# Patient Record
Sex: Male | Born: 1940 | Race: White | Hispanic: No | Marital: Married | Smoking: Former smoker
Health system: Southern US, Community
[De-identification: ages and names within clinical notes are randomized; demographics above are authoritative.]

## PROBLEM LIST (undated history)

## (undated) DIAGNOSIS — R7303 Prediabetes: Secondary | ICD-10-CM

## (undated) DIAGNOSIS — Z8489 Family history of other specified conditions: Secondary | ICD-10-CM

## (undated) DIAGNOSIS — J449 Chronic obstructive pulmonary disease, unspecified: Secondary | ICD-10-CM

## (undated) DIAGNOSIS — I10 Essential (primary) hypertension: Secondary | ICD-10-CM

## (undated) DIAGNOSIS — N184 Chronic kidney disease, stage 4 (severe): Secondary | ICD-10-CM

## (undated) DIAGNOSIS — I4821 Permanent atrial fibrillation: Secondary | ICD-10-CM

## (undated) DIAGNOSIS — I700 Atherosclerosis of aorta: Secondary | ICD-10-CM

## (undated) DIAGNOSIS — I499 Cardiac arrhythmia, unspecified: Secondary | ICD-10-CM

## (undated) DIAGNOSIS — M109 Gout, unspecified: Secondary | ICD-10-CM

## (undated) HISTORY — DX: Chronic obstructive pulmonary disease, unspecified: J44.9

## (undated) HISTORY — DX: Prediabetes: R73.03

## (undated) HISTORY — DX: Atherosclerosis of aorta: I70.0

## (undated) HISTORY — DX: Permanent atrial fibrillation: I48.21

## (undated) HISTORY — DX: Gout, unspecified: M10.9

## (undated) HISTORY — PX: ATRIAL FIBRILLATION ABLATION: SHX5732

## (undated) HISTORY — DX: Chronic kidney disease, stage 4 (severe): N18.4

---

## 2020-07-22 ENCOUNTER — Other Ambulatory Visit: Payer: Self-pay

## 2020-07-22 ENCOUNTER — Ambulatory Visit (INDEPENDENT_AMBULATORY_CARE_PROVIDER_SITE_OTHER): Payer: Medicare Other

## 2020-07-22 ENCOUNTER — Encounter: Payer: Self-pay | Admitting: Pulmonary Disease

## 2020-07-22 ENCOUNTER — Ambulatory Visit (INDEPENDENT_AMBULATORY_CARE_PROVIDER_SITE_OTHER): Payer: Medicare Other | Admitting: Pulmonary Disease

## 2020-07-22 VITALS — BP 116/74 | HR 79 | Ht 67.0 in | Wt 165.6 lb

## 2020-07-22 DIAGNOSIS — J439 Emphysema, unspecified: Secondary | ICD-10-CM

## 2020-07-22 DIAGNOSIS — J441 Chronic obstructive pulmonary disease with (acute) exacerbation: Secondary | ICD-10-CM | POA: Diagnosis not present

## 2020-07-22 NOTE — Patient Instructions (Signed)
Is good to meet you We will get an x-ray today and records from primary care regarding your recent labs It is okay to stop the anoro as you are asymptomatic with no exacerbations We will schedule PFTs in 3 months and follow-up in clinic on the same day.

## 2020-07-22 NOTE — Progress Notes (Signed)
Cory Morris    [id number]    September 24, 1940  Primary Care Physician:Freeman, Rocco Pauls, NP  Referring Physician: Roetta Sessions, NP [address] [place],  [place] [address]  Chief complaint:   Consult for COPD  HPI: 79 year old with history of chronic kidney disease stage III, hypertension, COPD, hyperlipidemia, gout, atrial fibrillation, HFpEF.  Referred for evaluation of COPD.  He is moved here recently from [place] to be closer to family.  Previously followed by Dr. Candelaria Celeste at [facility].  Maintained on anoro which has not made any difference with his breathing. He is relatively sedentary and denies any dyspnea, cough, mucus production, wheezing.  He wants to know if he can come off anoro as its not helping and is expensive.  Pets: No pets Occupation: Retired Retail buyer Exposures: No exposure to asbestos.  No mold, hot tub, Jacuzzi.  No feather pillows or comforters Smoking history: 60-pack-year smoker.  Quit in April 2021 Travel history: From [place].  No significant recent travel Relevant family history: No significant family history of lung disease   No outpatient encounter medications on file as of 07/22/2020.   No facility-administered encounter medications on file as of 07/22/2020.    Allergies as of 07/22/2020  . (Not on File)    No past medical history on file.  History reviewed. No pertinent surgical history.  No family history on file.  Social History   Socioeconomic History  . Marital status: Married    Spouse name: Not on file  . Number of children: Not on file  . Years of education: Not on file  . Highest education level: Not on file  Occupational History  . Not on file  Tobacco Use  . Smoking status: Current Every Day Smoker    Packs/day: 1.00    Years: 60.00    Pack years: 60.00    Types: Cigarettes  . Smokeless tobacco: Never Used  Substance and Sexual Activity  . Alcohol  use: Not on file  . Drug use: Not on file  . Sexual activity: Not on file  Other Topics Concern  . Not on file  Social History Narrative  . Not on file   Social Determinants of Health   Financial Resource Strain: Not on file  Food Insecurity: Not on file  Transportation Needs: Not on file  Physical Activity: Not on file  Stress: Not on file  Social Connections: Not on file  Intimate Partner Violence: Not on file    Review of systems: Review of Systems  Constitutional: Negative for fever and chills.  HENT: Negative.   Eyes: Negative for blurred vision.  Respiratory: as per HPI  Cardiovascular: Negative for chest pain and palpitations.  Gastrointestinal: Negative for vomiting, diarrhea, blood per rectum. Genitourinary: Negative for dysuria, urgency, frequency and hematuria.  Musculoskeletal: Negative for myalgias, back pain and joint pain.  Skin: Negative for itching and rash.  Neurological: Negative for dizziness, tremors, focal weakness, seizures and loss of consciousness.  Endo/Heme/Allergies: Negative for environmental allergies.  Psychiatric/Behavioral: Negative for depression, suicidal ideas and hallucinations.  All other systems reviewed and are negative.  Physical Exam: Blood pressure 116/74, pulse 79, height 5\' 7"  (1.702 m), weight 165 lb 9.6 oz (75.1 kg), SpO2 99 %. Gen:      No acute distress HEENT:  EOMI, sclera anicteric Neck:     No masses; no thyromegaly Lungs:    Clear to auscultation bilaterally; normal respiratory effort  CV:         Regular rate and rhythm; no murmurs Abd:      + bowel sounds; soft, non-tender; no palpable masses, no distension Ext:    No edema; adequate peripheral perfusion Skin:      Warm and dry; no rash Neuro: alert and oriented x 3 Psych: normal mood and affect  Data Reviewed: Imaging:  PFTs:  Labs:  Assessment:  COPD Get chest x-ray, PFTs for baseline assessment He recently had labs at his primary care.  We will get  those for our record.  Okay to stop anoro as it does not help and is expensive. He is asymptomatic with his breathing.  Ex-smoker Congratulated on quitting smoking Review chest x-ray He is beyond the age cutoff for screening CTs of the chest.  Plan/Recommendations: Chest x-ray, PFTs Stop Truddie Crumble MD [facility] 07/22/2020, 9:52 AM  CC: Roetta Sessions*

## 2020-10-20 ENCOUNTER — Ambulatory Visit (INDEPENDENT_AMBULATORY_CARE_PROVIDER_SITE_OTHER): Payer: Medicare Other | Admitting: Pulmonary Disease

## 2020-10-20 ENCOUNTER — Ambulatory Visit: Payer: Medicare Other | Admitting: Pulmonary Disease

## 2020-10-20 ENCOUNTER — Other Ambulatory Visit: Payer: Self-pay

## 2020-10-20 DIAGNOSIS — J439 Emphysema, unspecified: Secondary | ICD-10-CM

## 2020-10-20 LAB — PULMONARY FUNCTION TEST
DL/VA % pred: 60 %
DL/VA: 2.41 ml/min/mmHg/L
DLCO cor % pred: 45 %
DLCO cor: 9.67 ml/min/mmHg
DLCO unc % pred: 45 %
DLCO unc: 9.67 ml/min/mmHg
FEF 25-75 Post: 0.65 L/sec
FEF 25-75 Pre: 0.43 L/sec
FEF2575-%Change-Post: 52 %
FEF2575-%Pred-Post: 40 %
FEF2575-%Pred-Pre: 26 %
FEV1-%Change-Post: 18 %
FEV1-%Pred-Post: 50 %
FEV1-%Pred-Pre: 42 %
FEV1-Post: 1.19 L
FEV1-Pre: 1.01 L
FEV1FVC-%Change-Post: 2 %
FEV1FVC-%Pred-Pre: 67 %
FEV6-%Change-Post: 16 %
FEV6-%Pred-Post: 74 %
FEV6-%Pred-Pre: 63 %
FEV6-Post: 2.32 L
FEV6-Pre: 1.99 L
FEV6FVC-%Change-Post: 0 %
FEV6FVC-%Pred-Post: 103 %
FEV6FVC-%Pred-Pre: 103 %
FVC-%Change-Post: 16 %
FVC-%Pred-Post: 71 %
FVC-%Pred-Pre: 61 %
FVC-Post: 2.42 L
FVC-Pre: 2.09 L
Post FEV1/FVC ratio: 49 %
Post FEV6/FVC ratio: 96 %
Pre FEV1/FVC ratio: 48 %
Pre FEV6/FVC Ratio: 95 %
RV % pred: 161 %
RV: 3.93 L
TLC % pred: 100 %
TLC: 6.27 L

## 2020-10-20 NOTE — Progress Notes (Signed)
PFT done today. 

## 2020-11-11 ENCOUNTER — Ambulatory Visit (INDEPENDENT_AMBULATORY_CARE_PROVIDER_SITE_OTHER): Payer: Medicare Other | Admitting: Pulmonary Disease

## 2020-11-11 ENCOUNTER — Other Ambulatory Visit: Payer: Self-pay

## 2020-11-11 ENCOUNTER — Encounter: Payer: Self-pay | Admitting: Pulmonary Disease

## 2020-11-11 VITALS — BP 112/70 | HR 101 | Temp 97.5°F | Ht 67.0 in | Wt 159.8 lb

## 2020-11-11 DIAGNOSIS — J449 Chronic obstructive pulmonary disease, unspecified: Secondary | ICD-10-CM | POA: Diagnosis not present

## 2020-11-11 DIAGNOSIS — J439 Emphysema, unspecified: Secondary | ICD-10-CM | POA: Diagnosis not present

## 2020-11-11 MED ORDER — ALBUTEROL SULFATE HFA 108 (90 BASE) MCG/ACT IN AERS: 2.0000 | INHALATION_SPRAY | Freq: Four times a day (QID) | RESPIRATORY_TRACT | 6 refills | Status: DC | PRN

## 2020-11-11 MED ORDER — IPRATROPIUM-ALBUTEROL 0.5-2.5 (3) MG/3ML IN SOLN: 3.0000 mL | Freq: Four times a day (QID) | RESPIRATORY_TRACT | Status: DC | PRN

## 2020-11-11 NOTE — Addendum Note (Signed)
Addended by: Gavin Potters R on: 11/11/2020 10:58 AM   Modules accepted: Orders

## 2020-11-11 NOTE — Patient Instructions (Signed)
I have reviewed your PFTs which show severe COPD We discussed treatment with inhalers and per your preference we are holding off on regular treatment We will send in a prescription for albuterol rescue inhaler and duo nebs 3 times a day as needed for shortness of breath We will get records and labs from your primary care  Follow-up in 6 months

## 2020-11-11 NOTE — Progress Notes (Addendum)
       Cory Morris    [id number]    11/14/1940  Primary Care Physician:Freeman, Courtney James, NP  Referring Physician: Freeman, Courtney James, NP [address] [place],  [place] [address]  Chief complaint:   Follow up for COPD  HPI: 79-year-old with history of chronic kidney disease stage III, hypertension, COPD, hyperlipidemia, gout, atrial fibrillation, HFpEF.  Referred for evaluation of COPD.  He is moved here recently from [place] to be closer to family.  Previously followed by Dr. Leah Morris at [place] clinic.  Maintained on anoro which has not made any difference with his breathing. He is relatively sedentary and denies any dyspnea, cough, mucus production, wheezing.  He wants to know if he can come off anoro as its not helping and is expensive.  Pets: No pets Occupation: Retired telephone maintenance technician Exposures: No exposure to asbestos.  No mold, hot tub, Jacuzzi.  No feather pillows or comforters Smoking history: 60-pack-year smoker.  Quit in April 2021 Travel history: From [place].  No significant recent travel Relevant family history: No significant family history of lung disease  Interim history: Off anoro since December 2021 States that breathing is stable with no complaints Here for review of PFTs  Outpatient Encounter Medications as of 11/11/2020  Medication Sig  . allopurinol (ZYLOPRIM) 100 MG tablet Take 100 mg by mouth.  . Ascorbic Acid (VITAMIN C WITH ROSE HIPS) 1000 MG tablet Take 1,000 mg by mouth daily.  . atorvastatin (LIPITOR) 80 MG tablet Take 80 mg by mouth daily.  . cholecalciferol (VITAMIN D3) 25 MCG (1000 UNIT) tablet Take 2,000 Units by mouth daily.  . diltiazem (CARDIZEM CD) 120 MG 24 hr capsule Take 120 mg by mouth daily.  . latanoprost (XALATAN) 0.005 % ophthalmic solution 1 drop at bedtime.  . metoprolol tartrate (LOPRESSOR) 100 MG tablet Take 100 mg by mouth 2 (two) times daily.  . sertraline (ZOLOFT) 50 MG  tablet Take 50 mg by mouth daily.  . torsemide (DEMADEX) 20 MG tablet Take 20 mg by mouth daily. Monday Wednesday Friday   No facility-administered encounter medications on file as of 11/11/2020.    Physical Exam: Blood pressure 112/70, pulse (!) 101, temperature (!) 97.5 F (36.4 C), temperature source Temporal, height 5' 7" (1.702 m), weight 159 lb 12.8 oz (72.5 kg), SpO2 96 %. Gen:      No acute distress HEENT:  EOMI, sclera anicteric Neck:     No masses; no thyromegaly Lungs:    Clear to auscultation bilaterally; normal respiratory effort CV:         Regular rate and rhythm; no murmurs Abd:      + bowel sounds; soft, non-tender; no palpable masses, no distension Ext:    No edema; adequate peripheral perfusion Skin:      Warm and dry; no rash Neuro: alert and oriented x 3 Psych: normal mood and affect  Data Reviewed: Imaging: Chest x-ray 07/22/2020-small bilateral effusions, basal atelectasis, hyperinflation I have reviewed the images personally.  PFTs: 10/20/2020 FVC 2.42 [71%], FEV1 1.19 [50%], F/F 49, TLC 6.27 [100%], DLCO 9.67 [45%] Severe obstruction, diffusion defect with air trapping and bronchodilator response  Labs:  Assessment:  COPD Chest x-ray and PFT reviewed with severe COPD He recently had labs at his primary care.  We will get those for our record.  Off anoro since December 2021 I had a long discussion in office with Cory Morris and his daughter.  Recommended controller inhalers to reduce   exacerbations but the medications are too expensive for him and he has not been compliant as they are not helping with his breathing and he feels fine  Give him a prescription for albuterol and duo nebs as needed Reassess in 6 months  Ex-smoker Congratulated on quitting smoking He is beyond the age cutoff for screening CTs of the chest. Follow intermittent chest x-ray  Plan/Recommendations: Albuterol, duo nebs as needed Labs from primary care  Cory Mannam  Morris [facility] 11/11/2020, 9:54 AM  CC: Cory Morris*  Addendum: Received records from primary care  CBC 09/23/2020-WBC 7.58, eos 4.2% absolute eosinophil count 320 CBC 06/16/2020- WBC 8.67, eos 6%, absolute eosinophil count 520  CMP 09/23/2020 significant for alk phos 160, BUN 55, creatinine 2.19  Cory Morris [facility] 12/15/2020, 12:32 PM  

## 2020-11-30 ENCOUNTER — Telehealth: Payer: Self-pay | Admitting: Pulmonary Disease

## 2020-11-30 NOTE — Telephone Encounter (Signed)
Received a PA determination for albuterol HFA from [facility]. PA was approved until 08/12/2021. Called and spoke with pharmacist at [facility]. She was able to run the RX without any issues.   Nothing further needed.

## 2021-04-14 ENCOUNTER — Telehealth: Payer: Self-pay | Admitting: Pulmonary Disease

## 2021-04-14 NOTE — Telephone Encounter (Signed)
Pt sent a message via MyChart;  "Chest congestion, phlegmy cough, using inhaler four times a day to improve breathing"  Pt was last seen in [facility] by Dr. Vaughan Browner 11/11/2020 and is due for a follow up in October.  Pls regard; [phone]

## 2021-04-14 NOTE — Telephone Encounter (Signed)
I called and spoke with patient wife, who is on DPR, regarding mychart message. Wife ststed patient has been having more congestion and cough. Wife is requesting appt with PM, who is booked until October. I have made an appt with TP for 04/24/21. Wife verbalized understanding, nothing further needed

## 2021-04-24 ENCOUNTER — Ambulatory Visit: Payer: Medicare Other | Admitting: Adult Health

## 2021-05-29 DIAGNOSIS — E261 Secondary hyperaldosteronism: Secondary | ICD-10-CM | POA: Insufficient documentation

## 2021-05-29 DIAGNOSIS — E785 Hyperlipidemia, unspecified: Secondary | ICD-10-CM | POA: Insufficient documentation

## 2021-05-29 DIAGNOSIS — F3340 Major depressive disorder, recurrent, in remission, unspecified: Secondary | ICD-10-CM | POA: Insufficient documentation

## 2021-05-29 DIAGNOSIS — I482 Chronic atrial fibrillation, unspecified: Secondary | ICD-10-CM | POA: Insufficient documentation

## 2021-05-29 DIAGNOSIS — M109 Gout, unspecified: Secondary | ICD-10-CM | POA: Insufficient documentation

## 2021-05-29 DIAGNOSIS — I700 Atherosclerosis of aorta: Secondary | ICD-10-CM | POA: Insufficient documentation

## 2021-05-29 DIAGNOSIS — R059 Cough, unspecified: Secondary | ICD-10-CM | POA: Insufficient documentation

## 2021-05-29 DIAGNOSIS — I10 Essential (primary) hypertension: Secondary | ICD-10-CM | POA: Insufficient documentation

## 2021-05-29 DIAGNOSIS — R7303 Prediabetes: Secondary | ICD-10-CM | POA: Insufficient documentation

## 2021-05-29 DIAGNOSIS — N184 Chronic kidney disease, stage 4 (severe): Secondary | ICD-10-CM | POA: Insufficient documentation

## 2021-05-29 DIAGNOSIS — D8989 Other specified disorders involving the immune mechanism, not elsewhere classified: Secondary | ICD-10-CM | POA: Insufficient documentation

## 2021-05-29 DIAGNOSIS — J449 Chronic obstructive pulmonary disease, unspecified: Secondary | ICD-10-CM | POA: Insufficient documentation

## 2021-05-29 DIAGNOSIS — I509 Heart failure, unspecified: Secondary | ICD-10-CM | POA: Insufficient documentation

## 2021-07-10 ENCOUNTER — Other Ambulatory Visit: Payer: Self-pay

## 2021-07-10 ENCOUNTER — Encounter: Payer: Self-pay | Admitting: Internal Medicine

## 2021-07-10 ENCOUNTER — Ambulatory Visit (INDEPENDENT_AMBULATORY_CARE_PROVIDER_SITE_OTHER): Payer: Medicare Other | Admitting: Internal Medicine

## 2021-07-10 VITALS — BP 110/60 | HR 77 | Ht 67.0 in | Wt 166.8 lb

## 2021-07-10 DIAGNOSIS — N184 Chronic kidney disease, stage 4 (severe): Secondary | ICD-10-CM | POA: Diagnosis not present

## 2021-07-10 DIAGNOSIS — I4821 Permanent atrial fibrillation: Secondary | ICD-10-CM | POA: Diagnosis not present

## 2021-07-10 DIAGNOSIS — Z7901 Long term (current) use of anticoagulants: Secondary | ICD-10-CM | POA: Diagnosis not present

## 2021-07-10 DIAGNOSIS — J438 Other emphysema: Secondary | ICD-10-CM | POA: Diagnosis not present

## 2021-07-10 NOTE — Progress Notes (Signed)
OFFICE NOTE  Chief Complaint:  Established cardiologist  Primary Care Physician: Roetta Sessions, NP  HPI:  Cory Morris is a 80 y.o. male with a past medial history significant for A. fib, COPD, stage IV chronic kidney disease, depression, gout and aortic atherosclerosis, who presents to establish cardiac care.  Cory Morris [place].  He is accompanied by his daughter today.  She and her husband moved to the area a few years ago and recently moved him down here.  He previously was getting care at the [facility] clinic and his cardiologist was Dr. Daisy Floro.  With regards to his A. fib, it is likely he has permanent A. fib at this point.  He has reportedly had multiple prior cardioversions and ablation.  I will need to request those records.  He denies any obstructive coronary history such as CABG or prior PCI.  He reported he had a fall last evening.  He says he has had several falls over the past several years.  This occurred when getting out of his recliner.  He said he stood up and took a step and then became somewhat dizzy and fell.  He denies loss of consciousness.  He said he felt like the room might have been spinning which is suggestive of possible positional vertigo.  Does use a walker and has some marked kyphosis and is getting home PT.  PMHx:  Past Medical History:  Diagnosis Date   Aortic atherosclerosis (HCC)    CKD (chronic kidney disease) stage 4, GFR 15-29 ml/min (HCC)    COPD (chronic obstructive pulmonary disease) (HCC)    Gout    Permanent atrial fibrillation (HCC)    Prediabetes     Past Surgical History:  Procedure Laterality Date   ATRIAL FIBRILLATION ABLATION      FAMHx:  Family History  Problem Relation Age of Onset   Atrial fibrillation Neg Hx    Arrhythmia Neg Hx     SOCHx:   reports that he has quit smoking. His smoking use included cigarettes. He has a 60.00 pack-year smoking history. He  has never used smokeless tobacco. No history on file for alcohol use and drug use.  ALLERGIES:  Allergies  Allergen Reactions   Ace Inhibitors    Dronedarone     ROS: Pertinent items noted in HPI and remainder of comprehensive ROS otherwise negative.  HOME MEDS: Current Outpatient Medications on File Prior to Visit  Medication Sig Dispense Refill   albuterol (VENTOLIN HFA) 108 (90 Base) MCG/ACT inhaler Inhale 2 puffs into the lungs every 6 (six) hours as needed for wheezing or shortness of breath. 8 g 6   allopurinol (ZYLOPRIM) 100 MG tablet Take 100 mg by mouth.     Ascorbic Acid (VITAMIN C WITH ROSE HIPS) 1000 MG tablet Take 1,000 mg by mouth daily.     atorvastatin (LIPITOR) 80 MG tablet Take 80 mg by mouth daily.     cholecalciferol (VITAMIN D3) 25 MCG (1000 UNIT) tablet Take 2,000 Units by mouth daily.     diltiazem (CARDIZEM CD) 120 MG 24 hr capsule Take 120 mg by mouth daily.     ipratropium-albuterol (DUONEB) 0.5-2.5 (3) MG/3ML SOLN Take 3 mLs by nebulization every 6 (six) hours as needed. 360 mL o   latanoprost (XALATAN) 0.005 % ophthalmic solution 1 drop at bedtime.     metoprolol tartrate (LOPRESSOR) 100 MG tablet Take 100 mg by mouth 2 (two) times daily.  sertraline (ZOLOFT) 50 MG tablet Take 50 mg by mouth daily.     torsemide (DEMADEX) 20 MG tablet Take 20 mg by mouth daily. Monday Wednesday Friday     No current facility-administered medications on file prior to visit.    LABS/IMAGING: No results found for this or any previous visit (from the past 48 hour(s)). No results found.  LIPID PANEL: No results found for: CHOL, TRIG, HDL, CHOLHDL, VLDL, LDLCALC, LDLDIRECT   WEIGHTS: Wt Readings from Last 3 Encounters:  07/10/21 166 lb 12.8 oz (75.7 kg)  11/11/20 159 lb 12.8 oz (72.5 kg)  07/22/20 165 lb 9.6 oz (75.1 kg)    VITALS: BP 110/60   Pulse 77   Ht 5\' 7"  (1.702 m)   Wt 166 lb 12.8 oz (75.7 kg)   SpO2 92%   BMI 26.12 kg/m   EXAM: General  appearance: alert, no distress, and marked kyphosis Neck: no carotid bruit, no JVD, and thyroid not enlarged, symmetric, no tenderness/mass/nodules Lungs: diminished breath sounds bilaterally Heart: irregularly irregular rhythm Abdomen: soft, non-tender; bowel sounds normal; no masses,  no organomegaly Extremities: extremities normal, atraumatic, no cyanosis or edema and pain with palpation over the right lower posterior ribs, no ecchymosis Pulses: 2+ and symmetric Skin: Skin color, texture, turgor normal. No rashes or lesions Neurologic: Grossly normal Psych: Pleasant  EKG: A. fib at 77, RBBB- personally reviewed  ASSESSMENT: Probable permanent atrial fibrillation COPD Aortic atherosclerosis CKD 4  PLAN: 1.   [name] has probable permanent atrial fibrillation with prior ablation and multiple cardioversion attempts at the [facility] clinic.  We will obtain full records and review them.  He is anticoagulated on Eliquis 2.5 mg twice daily for age greater than 80 and creatinine greater than 1.5.  COPD is managed locally now by Dr. Vaughan Browner.  He also is followed by Dr. Candiss Norse in nephrology.  We will continue current medications and plan annual follow-up or sooner as necessary.  Thanks for the kind referral.  Pixie Casino, MD, FACC, [place] Director of the [facility] Diplomate of the American Board of Clinical Lipidology Attending Cardiologist  Direct Dial: [phone]  Fax: [phone]  Website:  [email or web address] 07/10/2021, 4:33 PM

## 2021-07-10 NOTE — Patient Instructions (Signed)

## 2021-09-13 ENCOUNTER — Encounter: Payer: Self-pay | Admitting: Pulmonary Disease

## 2021-09-13 ENCOUNTER — Other Ambulatory Visit: Payer: Self-pay

## 2021-09-13 ENCOUNTER — Ambulatory Visit (INDEPENDENT_AMBULATORY_CARE_PROVIDER_SITE_OTHER): Payer: Medicare Other

## 2021-09-13 ENCOUNTER — Ambulatory Visit (INDEPENDENT_AMBULATORY_CARE_PROVIDER_SITE_OTHER): Payer: Medicare Other | Admitting: Pulmonary Disease

## 2021-09-13 VITALS — BP 116/62 | HR 99 | Temp 98.1°F | Ht 66.0 in | Wt 160.0 lb

## 2021-09-13 DIAGNOSIS — J449 Chronic obstructive pulmonary disease, unspecified: Secondary | ICD-10-CM

## 2021-09-13 MED ORDER — IPRATROPIUM-ALBUTEROL 0.5-2.5 (3) MG/3ML IN SOLN: 3.0000 mL | Freq: Four times a day (QID) | RESPIRATORY_TRACT | 5 refills | Status: DC | PRN

## 2021-09-13 MED ORDER — ANORO ELLIPTA 62.5-25 MCG/ACT IN AEPB: 1.0000 | INHALATION_SPRAY | Freq: Every day | RESPIRATORY_TRACT | 2 refills | Status: DC

## 2021-09-13 MED ORDER — ALBUTEROL SULFATE HFA 108 (90 BASE) MCG/ACT IN AERS: 2.0000 | INHALATION_SPRAY | Freq: Four times a day (QID) | RESPIRATORY_TRACT | 5 refills | Status: DC | PRN

## 2021-09-13 NOTE — Patient Instructions (Signed)
Continue the prednisone and antibiotic as prescribed by primary care Continue the anoro inhaler.  We will send in a new prescription for this I will also check with the pharmacy to see if there are any cheaper alternatives.  It will be helpful if you can update your insurance information so we can get the most up-to-date information  We will get a chest x-ray Check CBC differential, IgE, CMP alpha-1 antitrypsin levels and phenotype today Follow-up in 4 weeks

## 2021-09-13 NOTE — Progress Notes (Signed)
Cory Morris    [id number]    1941/08/09  Primary Care Physician:Freeman, Rocco Pauls, NP  Referring Physician: Roetta Sessions, NP [address] [place],   [address]  Chief complaint:   Follow up for COPD  HPI: 81 year old with history of chronic kidney disease stage III, hypertension, COPD, hyperlipidemia, gout, atrial fibrillation, HFpEF.  Referred for evaluation of COPD.  He is moved here recently from [place] to be closer to family.  Previously followed by Dr. Candelaria Celeste at [facility] clinic.  Maintained on anoro which has not made any difference with his breathing. He is relatively sedentary and denies any dyspnea, cough, mucus production, wheezing.   Off anoro since December 2021 per patient preference due to cost of medication  Pets: No pets Occupation: Retired Retail buyer Exposures: No exposure to asbestos.  No mold, hot tub, Jacuzzi.  No feather pillows or comforters Smoking history: 60-pack-year smoker.  Quit in April 2021 Travel history: From [place].  No significant recent travel Relevant family history: No significant family history of lung disease  Interim history: Has worsening dyspnea since December 2023.  Tested negative for COVID Complains of cough, wheezing, clear mucus Started on prednisone 20 mg twice daily and doxycycline by his primary care He restarted anoro a few weeks ago  Outpatient Encounter Medications as of 09/13/2021  Medication Sig   albuterol (VENTOLIN HFA) 108 (90 Base) MCG/ACT inhaler Inhale 2 puffs into the lungs every 6 (six) hours as needed for wheezing or shortness of breath.   allopurinol (ZYLOPRIM) 100 MG tablet Take 100 mg by mouth.   Ascorbic Acid (VITAMIN C WITH ROSE HIPS) 1000 MG tablet Take 1,000 mg by mouth daily.   atorvastatin (LIPITOR) 80 MG tablet Take 80 mg by mouth daily.   cholecalciferol (VITAMIN D3) 25 MCG (1000 UNIT) tablet Take 2,000 Units by mouth daily.    diltiazem (CARDIZEM CD) 120 MG 24 hr capsule Take 120 mg by mouth daily.   DOXYCYCLINE PO Take by mouth in the morning and at bedtime. Started 1/31   ipratropium-albuterol (DUONEB) 0.5-2.5 (3) MG/3ML SOLN Take 3 mLs by nebulization every 6 (six) hours as needed.   latanoprost (XALATAN) 0.005 % ophthalmic solution 1 drop at bedtime.   metoprolol tartrate (LOPRESSOR) 100 MG tablet Take 100 mg by mouth 2 (two) times daily.   PREDNISONE PO Take by mouth. 10 day taper- started 1/31   sertraline (ZOLOFT) 50 MG tablet Take 50 mg by mouth daily.   torsemide (DEMADEX) 20 MG tablet Take 20 mg by mouth daily. Monday Wednesday Friday   No facility-administered encounter medications on file as of 09/13/2021.    Physical Exam: Blood pressure 116/62, pulse 99, temperature 98.1 F (36.7 C), temperature source Oral, height _0  (1.676 m), weight 160 lb (72.6 kg), SpO2 96 %. Gen:      No acute distress HEENT:  EOMI, sclera anicteric Neck:     No masses; no thyromegaly Lungs:    Clear to auscultation bilaterally; normal respiratory effort CV:         Regular rate and rhythm; no murmurs Abd:      + bowel sounds; soft, non-tender; no palpable masses, no distension Ext:    No edema; adequate peripheral perfusion Skin:      Warm and dry; no rash Neuro: alert and oriented x 3 Psych: normal mood and affect   Data Reviewed: Imaging: Chest x-ray 07/22/2020-small bilateral effusions, basal atelectasis, hyperinflation  I have reviewed the images personally.  PFTs: 10/20/2020 FVC 2.42 [71%], FEV1 1.19 [50%], F/F 49, TLC 6.27 [100%], DLCO 9.67 [45%] Severe obstruction, diffusion defect with air trapping and bronchodilator response  Labs: Labs from primary care CBC 09/23/2020-WBC 7.58, eos 4.2% absolute eosinophil count 320 CBC 06/16/2020- WBC 8.67, eos 6%, absolute eosinophil count 520 CMP 09/23/2020 significant for alk phos 160, BUN 55, creatinine 2.19  Assessment:  COPD with exacerbation Chest x-ray and PFT  reviewed with severe COPD Off anoro since December 2021  Restarted recently after he had a worsening breathing.  Seems to be in COPD exacerbation Started on prednisone, doxycycline by his primary care We will get chest x-ray and labs today We will work with pharmacy to see if there is a cheaper alternative to anoro as its Kozlow somebody in the past  Plan/Recommendations: Resume anoro Prednisone, doxycycline Chest x-ray, labs  Marshell Garfinkel MD [facility] none 09/13/2021, 3:31 PM  CC: Roetta Sessions*

## 2021-09-14 ENCOUNTER — Ambulatory Visit: Payer: Medicare Other | Admitting: Pulmonary Disease

## 2021-09-14 ENCOUNTER — Other Ambulatory Visit (HOSPITAL_COMMUNITY): Payer: Self-pay

## 2021-09-14 ENCOUNTER — Encounter: Payer: Self-pay | Admitting: Pulmonary Disease

## 2021-09-14 ENCOUNTER — Telehealth: Payer: Self-pay | Admitting: Pharmacy Technician

## 2021-09-14 DIAGNOSIS — J439 Emphysema, unspecified: Secondary | ICD-10-CM

## 2021-09-14 DIAGNOSIS — J449 Chronic obstructive pulmonary disease, unspecified: Secondary | ICD-10-CM

## 2021-09-14 LAB — COMPREHENSIVE METABOLIC PANEL
ALT: 8 U/L (ref 0–53)
AST: 13 U/L (ref 0–37)
Albumin: 3.9 g/dL (ref 3.5–5.2)
Alkaline Phosphatase: 127 U/L — ABNORMAL HIGH (ref 39–117)
BUN: 42 mg/dL — ABNORMAL HIGH (ref 6–23)
CO2: 33 mEq/L — ABNORMAL HIGH (ref 19–32)
Calcium: 9.1 mg/dL (ref 8.4–10.5)
Chloride: 96 mEq/L (ref 96–112)
Creatinine, Ser: 2.26 mg/dL — ABNORMAL HIGH (ref 0.40–1.50)
GFR: 26.57 mL/min — ABNORMAL LOW (ref >60.00)
Glucose, Bld: 103 mg/dL — ABNORMAL HIGH (ref 70–99)
Potassium: 4.6 mEq/L (ref 3.5–5.1)
Sodium: 140 mEq/L (ref 135–145)
Total Bilirubin: 0.6 mg/dL (ref 0.2–1.2)
Total Protein: 7.3 g/dL (ref 6.0–8.3)

## 2021-09-14 LAB — CBC WITH DIFFERENTIAL/PLATELET
Basophils Absolute: 0.1 10*3/uL (ref 0.0–0.1)
Basophils Relative: 0.8 % (ref 0.0–3.0)
Eosinophils Absolute: 0.2 10*3/uL (ref 0.0–0.7)
Eosinophils Relative: 1.4 % (ref 0.0–5.0)
HCT: 39.6 % (ref 39.0–52.0)
Hemoglobin: 12.6 g/dL — ABNORMAL LOW (ref 13.0–17.0)
Lymphocytes Relative: 8.7 % — ABNORMAL LOW (ref 12.0–46.0)
Lymphs Abs: 1.3 10*3/uL (ref 0.7–4.0)
MCHC: 31.8 g/dL (ref 30.0–36.0)
MCV: 88.6 fl (ref 78.0–100.0)
Monocytes Absolute: 1.6 10*3/uL — ABNORMAL HIGH (ref 0.1–1.0)
Monocytes Relative: 10.6 % (ref 3.0–12.0)
Neutro Abs: 11.5 10*3/uL — ABNORMAL HIGH (ref 1.4–7.7)
Neutrophils Relative %: 78.5 % — ABNORMAL HIGH (ref 43.0–77.0)
Platelets: 246 10*3/uL (ref 150.0–400.0)
RBC: 4.47 Mil/uL (ref 4.22–5.81)
RDW: 16.3 % — ABNORMAL HIGH (ref 11.5–15.5)
WBC: 14.7 10*3/uL — ABNORMAL HIGH (ref 4.0–10.5)

## 2021-09-14 NOTE — Telephone Encounter (Signed)
-----   Message from Marshell Garfinkel, MD sent at 09/13/2021  4:47 PM EST ----- Patient is on inhaler but cannot afford medication.  Can you see if there are any other alternatives.  He would benefit from being on triple therapy with inhaled steroids, LABA and LAMA

## 2021-09-14 NOTE — Telephone Encounter (Signed)
Patient Advocate Encounter   Per Test Claim: Stiolto and Cory Morris are both $47. I'm not sure how much the pt paid for the Anoro, but it's too soon to fill until 10/06/21. It may be that they have a deductible to meet and once they meet that the Anoro will also be $47, but I don't know that for sure.

## 2021-09-15 NOTE — Telephone Encounter (Signed)
ATC patient.  LM to call office for lab results and inhaler information.

## 2021-09-19 NOTE — Telephone Encounter (Signed)
Called and spoke with patient's daughter. She verbalized understanding of lab results. She stated that at the time of the visit and lab draw, he had not started the prednisone yet. She also stated that he hadn't been on prednisone for the past 3-4 months. She is concerned about the WBC count.   When I mentioned breathing test, she was confused. He is scheduled for a F/U later this month but no PFT has been scheduled. She wanted to know if this was something he needed to have before his F/U.   While on the phone, I spoke with her about the inhaler options. She verbalized understanding and for now will stay with the Anoro since it is the same price as the other inhalers.   Dr. Vaughan Browner, can you please advise? Thanks!

## 2021-09-19 NOTE — Telephone Encounter (Addendum)
Yes. We did not order PFTs at last visit.  So they are not pending.  It was a mistake on my part.  Regarding the white blood count I do not have a baseline to compare it with.  Can you put in follow-up CBC to make sure it is improving

## 2021-09-20 NOTE — Addendum Note (Signed)
Addended by: Elby Beck R on: 09/20/2021 11:00 AM   Modules accepted: Orders

## 2021-09-20 NOTE — Telephone Encounter (Signed)
Called and spoke with Cory Morris to let her know of message from Dr. Vaughan Browner and that he would like to order repeat blood work so that we can see if his numbers are improving. She expressed understanding. Order has been placed. Nothing further needed at this time.

## 2021-09-25 ENCOUNTER — Other Ambulatory Visit (INDEPENDENT_AMBULATORY_CARE_PROVIDER_SITE_OTHER): Payer: Medicare Other

## 2021-09-25 DIAGNOSIS — J449 Chronic obstructive pulmonary disease, unspecified: Secondary | ICD-10-CM | POA: Diagnosis not present

## 2021-09-25 DIAGNOSIS — J439 Emphysema, unspecified: Secondary | ICD-10-CM | POA: Diagnosis not present

## 2021-09-25 LAB — CBC WITH DIFFERENTIAL/PLATELET
Basophils Absolute: 0.1 10*3/uL (ref 0.0–0.1)
Basophils Relative: 0.6 % (ref 0.0–3.0)
Eosinophils Absolute: 0.3 10*3/uL (ref 0.0–0.7)
Eosinophils Relative: 2.4 % (ref 0.0–5.0)
HCT: 39.4 % (ref 39.0–52.0)
Hemoglobin: 12.5 g/dL — ABNORMAL LOW (ref 13.0–17.0)
Lymphocytes Relative: 17 % (ref 12.0–46.0)
Lymphs Abs: 1.8 10*3/uL (ref 0.7–4.0)
MCHC: 31.8 g/dL (ref 30.0–36.0)
MCV: 88.2 fl (ref 78.0–100.0)
Monocytes Absolute: 1.2 10*3/uL — ABNORMAL HIGH (ref 0.1–1.0)
Monocytes Relative: 11.2 % (ref 3.0–12.0)
Neutro Abs: 7.4 10*3/uL (ref 1.4–7.7)
Neutrophils Relative %: 68.8 % (ref 43.0–77.0)
Platelets: 201 10*3/uL (ref 150.0–400.0)
RBC: 4.46 Mil/uL (ref 4.22–5.81)
RDW: 16.9 % — ABNORMAL HIGH (ref 11.5–15.5)
WBC: 10.8 10*3/uL — ABNORMAL HIGH (ref 4.0–10.5)

## 2021-09-25 LAB — IGE: IgE (Immunoglobulin E), Serum: 275 kU/L — ABNORMAL HIGH (ref <=114)

## 2021-09-25 LAB — ALPHA-1 ANTITRYPSIN PHENOTYPE: A-1 Antitrypsin, Ser: 240 mg/dL — ABNORMAL HIGH (ref 83–199)

## 2021-10-09 ENCOUNTER — Ambulatory Visit (INDEPENDENT_AMBULATORY_CARE_PROVIDER_SITE_OTHER): Payer: Medicare Other | Admitting: Pulmonary Disease

## 2021-10-09 ENCOUNTER — Encounter: Payer: Self-pay | Admitting: Pulmonary Disease

## 2021-10-09 ENCOUNTER — Other Ambulatory Visit: Payer: Self-pay

## 2021-10-09 VITALS — BP 116/70 | HR 94 | Temp 98.0°F | Ht 67.0 in | Wt 161.6 lb

## 2021-10-09 DIAGNOSIS — J849 Interstitial pulmonary disease, unspecified: Secondary | ICD-10-CM

## 2021-10-09 DIAGNOSIS — J449 Chronic obstructive pulmonary disease, unspecified: Secondary | ICD-10-CM | POA: Diagnosis not present

## 2021-10-09 MED ORDER — TRELEGY ELLIPTA 200-62.5-25 MCG/ACT IN AEPB: 1.0000 | INHALATION_SPRAY | Freq: Every day | RESPIRATORY_TRACT | 2 refills | Status: DC

## 2021-10-09 NOTE — Progress Notes (Signed)
° °      °Cory Morris    [id number]    04/28/1941 ° °Primary Care Physician:Freeman, Courtney James, NP ° °Referring Physician: Freeman, Courtney James, NP °[address] °,  [place] [address] ° °Chief complaint:   °Follow up for COPD ° °HPI: °81-year-old with history of chronic kidney disease stage III, hypertension, COPD, hyperlipidemia, gout, atrial fibrillation, HFpEF.  Referred for evaluation of COPD. ° °He is moved here recently from [place] to be closer to family.  Previously followed by Dr. Leah Spinner at [place] [facility].  Maintained on anoro which has not made any difference with his breathing. °He is relatively sedentary and denies any dyspnea, cough, mucus production, wheezing.   °Off anoro since December 2021 per patient preference due to cost of medication ° °Pets: No pets °Occupation: Retired telephone maintenance technician °Exposures: No exposure to asbestos.  No mold, hot tub, Jacuzzi.  No feather pillows or comforters °Smoking history: 60-pack-year smoker.  Quit in April 2021 °Travel history: From [place].  No significant recent travel °Relevant family history: No significant family history of lung disease ° °Interim history: °Has worsening dyspnea since December 2023.  Tested negative for COVID °Continues on anoro °Complains of cough, wheezing, clear mucus ° ° °Outpatient Encounter Medications as of 10/09/2021  °Medication Sig  ° albuterol (VENTOLIN HFA) 108 (90 Base) MCG/ACT inhaler Inhale 2 puffs into the lungs every 6 (six) hours as needed for wheezing or shortness of breath.  ° allopurinol (ZYLOPRIM) 100 MG tablet Take 100 mg by mouth.  ° Ascorbic Acid (VITAMIN C WITH ROSE HIPS) 1000 MG tablet Take 1,000 mg by mouth daily.  ° atorvastatin (LIPITOR) 80 MG tablet Take 80 mg by mouth daily.  ° cetirizine (ZYRTEC) 10 MG tablet Take 10 mg by mouth daily.  ° cholecalciferol (VITAMIN D3) 25 MCG (1000 UNIT) tablet Take 2,000 Units by mouth daily.  ° diltiazem (CARDIZEM CD) 120  MG 24 hr capsule Take 120 mg by mouth daily.  ° ELIQUIS 2.5 MG TABS tablet Take 2.5 mg by mouth 2 (two) times daily.  ° ipratropium-albuterol (DUONEB) 0.5-2.5 (3) MG/3ML SOLN Take 3 mLs by nebulization every 6 (six) hours as needed.  ° latanoprost (XALATAN) 0.005 % ophthalmic solution 1 drop at bedtime.  ° metoprolol tartrate (LOPRESSOR) 100 MG tablet Take 100 mg by mouth 2 (two) times daily.  ° sertraline (ZOLOFT) 50 MG tablet Take 50 mg by mouth daily.  ° torsemide (DEMADEX) 20 MG tablet Take 20 mg by mouth daily. Monday Wednesday Friday  ° umeclidinium-vilanterol (ANORO ELLIPTA) 62.5-25 MCG/ACT AEPB Inhale 1 puff into the lungs daily.  ° [DISCONTINUED] DOXYCYCLINE PO Take by mouth in the morning and at bedtime. Started 1/31  ° [DISCONTINUED] metoprolol succinate (TOPROL-XL) 100 MG 24 hr tablet Take by mouth.  ° [DISCONTINUED] PREDNISONE PO Take by mouth. 10 day taper- started 1/31  ° °No facility-administered encounter medications on file as of 10/09/2021.  ° ° °Physical Exam: °Blood pressure 116/70, pulse 94, temperature 98 °F (36.7 °C), temperature source Oral, height 5' 7" (1.702 m), weight 161 lb 9.6 oz (73.3 kg), SpO2 97 %. °Gen:      No acute distress °HEENT:  EOMI, sclera anicteric °Neck:     No masses; no thyromegaly °Lungs:    Clear to auscultation bilaterally; normal respiratory effort °CV:         Regular rate and rhythm; no murmurs °Abd:      + bowel sounds; soft, non-tender; no palpable masses,   bowel sounds; soft, non-tender; no palpable masses, no distension Ext:    No edema; adequate peripheral perfusion Skin:      Warm and dry; no rash Neuro: alert and oriented x 3 Psych: normal mood and affect   Data Reviewed: Imaging: Chest x-ray 07/22/2020-small bilateral effusions, basal atelectasis, hyperinflation Chest x-ray 09/13/2021-chronic interstitial changes I have reviewed the images personally.  PFTs: 10/20/2020 FVC 2.42 [71%], FEV1 1.19 [50%], F/F 49, TLC 6.27 [100%], DLCO 9.67 [45%] Severe obstruction, diffusion defect with air trapping  and bronchodilator response  Labs: Labs from primary care CBC 09/23/2020-WBC 7.58, eos 4.2% absolute eosinophil count 320 CBC 06/16/2020- WBC 8.67, eos 6%, absolute eosinophil count 520 CMP 09/23/2020 significant for alk phos 160, BUN 55, creatinine 2.19  CBC 09/25/2021-WBC 10.8, eos 2.4%, absolute eosinophil count 259 IgE 09/13/2021-275 Alpha-1 antitrypsin 09/13/2021-240, PI MM  Assessment:  COPD, asthma Change Anoro to Trelegy as he continues to be symptomatic He does have bronchodilator response, elevated peripheral eosinophils and IgE suggestive of COPD, asthma overlap syndrome Chest x-ray reviewed with mild interstitial changes.  Order high-res CT for evaluation  Plan/Recommendations: Change Anoro to Trelegy High-res CT  Marshell Garfinkel MD [facility] none 10/09/2021, 10:51 AM  CC: Roetta Sessions*

## 2021-10-09 NOTE — Patient Instructions (Addendum)
We will change anoro to Trelegy 200 We will get a high-res CT to evaluate chronic interstitial changes found on chest x-ray  Follow-up in 3 months

## 2021-10-12 ENCOUNTER — Ambulatory Visit (HOSPITAL_COMMUNITY): Payer: Medicare Other

## 2021-10-30 ENCOUNTER — Encounter: Payer: Self-pay | Admitting: Pulmonary Disease

## 2021-10-31 ENCOUNTER — Encounter: Payer: Self-pay | Admitting: Pulmonary Disease

## 2021-10-31 ENCOUNTER — Other Ambulatory Visit: Payer: Self-pay

## 2021-10-31 ENCOUNTER — Telehealth: Payer: Self-pay | Admitting: Adult Health

## 2021-10-31 ENCOUNTER — Ambulatory Visit (INDEPENDENT_AMBULATORY_CARE_PROVIDER_SITE_OTHER): Payer: Medicare Other | Admitting: Pulmonary Disease

## 2021-10-31 VITALS — BP 116/60 | HR 81 | Temp 97.6°F | Ht 67.0 in | Wt 163.8 lb

## 2021-10-31 DIAGNOSIS — J449 Chronic obstructive pulmonary disease, unspecified: Secondary | ICD-10-CM | POA: Diagnosis not present

## 2021-10-31 DIAGNOSIS — J849 Interstitial pulmonary disease, unspecified: Secondary | ICD-10-CM

## 2021-10-31 MED ORDER — AZITHROMYCIN 250 MG PO TABS: ORAL_TABLET | ORAL | 0 refills | Status: DC

## 2021-10-31 MED ORDER — PREDNISONE 20 MG PO TABS: ORAL_TABLET | ORAL | 0 refills | Status: DC

## 2021-10-31 NOTE — Telephone Encounter (Signed)
A call from pharmacist at [facility] regarding today's prescriptions for Z-Pak and prednisone.  Prescription directions were not clear per pharmacy. ?Prescriptions were clarified with prednisone being 40 mg daily for 5 days ?Z-Pak 250 mg 2 tablets the first day and then 1 tablet daily until gone. ? ?FYI sent to Dr. Vaughan Browner  ? ?

## 2021-10-31 NOTE — Progress Notes (Signed)
? ?      ?Cory Morris    [id number]    [birth year] ? ?Primary Care Physician:Reddy, Mahitha, MD ? ?Referring Physician: Roetta Sessions, NP ?[address] ?[place],  [place] [address] ? ?Chief complaint:   ?Follow up for COPD ? ?HPI: ?81 year old with history of chronic kidney disease stage III, hypertension, COPD, hyperlipidemia, gout, atrial fibrillation, HFpEF.  Referred for evaluation of COPD. ? ?He is moved here recently from [place] to be closer to family.  Previously followed by Dr. Candelaria Celeste at [facility] clinic.  Maintained on anoro which has not made any difference with his breathing. ?He is relatively sedentary and denies any dyspnea, cough, mucus production, wheezing.   ?Off anoro since December 2021 per patient preference due to cost of medication ? ?Pets: No pets ?Occupation: Retired Retail buyer ?Exposures: No exposure to asbestos.  No mold, hot tub, Jacuzzi.  No feather pillows or comforters ?Smoking history: 60-pack-year smoker.  Quit in April 2021 ?Travel history: From [place].  No significant recent travel ?Relevant family history: No significant family history of lung disease ? ?Interim history: ?At last visit anoro was changed to Trelegy.  Has not made the change yet ?Is here as an acute visit for worsening cough, congestion.  Occasional wheeze.  Denies any fevers, chills ? ?Outpatient Encounter Medications as of 10/31/2021  ?Medication Sig  ? albuterol (VENTOLIN HFA) 108 (90 Base) MCG/ACT inhaler Inhale 2 puffs into the lungs every 6 (six) hours as needed for wheezing or shortness of breath.  ? allopurinol (ZYLOPRIM) 100 MG tablet Take 100 mg by mouth.  ? Ascorbic Acid (VITAMIN C WITH ROSE HIPS) 1000 MG tablet Take 1,000 mg by mouth daily.  ? atorvastatin (LIPITOR) 80 MG tablet Take 80 mg by mouth daily.  ? cetirizine (ZYRTEC) 10 MG tablet Take 10 mg by mouth daily.  ? cholecalciferol (VITAMIN D3) 25 MCG (1000 UNIT) tablet Take 2,000 Units by mouth  daily.  ? diltiazem (CARDIZEM CD) 120 MG 24 hr capsule Take 120 mg by mouth daily.  ? ELIQUIS 2.5 MG TABS tablet Take 2.5 mg by mouth 2 (two) times daily.  ? ipratropium-albuterol (DUONEB) 0.5-2.5 (3) MG/3ML SOLN Take 3 mLs by nebulization every 6 (six) hours as needed.  ? latanoprost (XALATAN) 0.005 % ophthalmic solution 1 drop at bedtime.  ? metoprolol tartrate (LOPRESSOR) 100 MG tablet Take 100 mg by mouth 2 (two) times daily.  ? sertraline (ZOLOFT) 50 MG tablet Take 50 mg by mouth daily.  ? torsemide (DEMADEX) 20 MG tablet Take 20 mg by mouth daily. Monday Wednesday Friday  ? Fluticasone-Umeclidin-Vilant (TRELEGY ELLIPTA) 200-62.5-25 MCG/ACT AEPB Inhale 1 puff into the lungs daily. (Patient not taking: Reported on 10/31/2021)  ? ?No facility-administered encounter medications on file as of 10/31/2021.  ? ? ?Physical Exam: ?Blood pressure 116/60, pulse 81, temperature 97.6 ?F (36.4 ?C), temperature source Oral, height $RemoveBefo'5\' 7"'EbBFnbyYxjZ$  (1.702 m), weight 163 lb 12.8 oz (74.3 kg), SpO2 95 %. ?Gen:      No acute distress ?HEENT:  EOMI, sclera anicteric ?Neck:     No masses; no thyromegaly ?Lungs:    Scattered expiratory wheeze, crackle ?CV:         Regular rate and rhythm; no murmurs ?Abd:      + bowel sounds; soft, non-tender; no palpable masses, no distension ?Ext:    No edema; adequate peripheral perfusion ?Skin:      Warm and dry; no rash ?Neuro: alert and oriented x 3 ?Psych:  normal mood and affect  ? ?Data Reviewed: ?Imaging: ?Chest x-ray 07/22/2020-small bilateral effusions, basal atelectasis, hyperinflation ?Chest x-ray 09/13/2021-chronic interstitial changes ?I have reviewed the images personally. ? ?PFTs: ?10/20/2020 ?FVC 2.42 [71%], FEV1 1.19 [50%], F/F 49, TLC 6.27 [100%], DLCO 9.67 [45%] ?Severe obstruction, diffusion defect with air trapping and bronchodilator response ? ?Labs: ?Labs from primary care ?CBC 09/23/2020-WBC 7.58, eos 4.2% absolute eosinophil count 320 ?CBC 06/16/2020- WBC 8.67, eos 6%, absolute eosinophil  count 520 ?CMP 09/23/2020 significant for alk phos 160, BUN 55, creatinine 2.19 ? ?CBC 09/25/2021-WBC 10.8, eos 2.4%, absolute eosinophil count 259 ?IgE 09/13/2021-275 ?Alpha-1 antitrypsin 09/13/2021-240, PI MM ? ?Assessment:  ?Acute exacerbation of COPD ?Treat with Z-Pak and prednisone 40 mg a day for 5 days ? ?COPD, asthma ?Change Anoro to Trelegy as he continues to be symptomatic ?He does have bronchodilator response, elevated peripheral eosinophils and IgE suggestive of COPD, asthma overlap syndrome ?Chest x-ray reviewed with mild interstitial changes.  High-res CT ordered but has not been done yet.  We will push this out to 3 months later as he is in the middle of an exacerbation now ? ? ?Plan/Recommendations: ?Z-Pak, prednisone ?Change Anoro to Trelegy ?High-res CT in 3 months ? ?Marshell Garfinkel MD ?[facility] none ?10/31/2021, 4:19 PM ? ?CC: Roetta Sessions* ? ?

## 2021-10-31 NOTE — Patient Instructions (Signed)
We will give you a Z-Pak and prednisone 40 mg a day for 5 days ?Start the Trelegy inhaler ?We will reschedule the high-res CT for 3 months and follow-up in clinic in 3 months. ?

## 2021-10-31 NOTE — Telephone Encounter (Signed)
Received the following message from patient's daughter:  ? ?"Hi Dr Vaughan Browner! My dad - Cory Morris, birthdate Apr 27, 1941- is having that wheezing again. He has been fine ever since the cycle of prednesone and doxycyclene that he completed on 10 Feb. The wheezing just started yesterday, I have no idea why. It is not horrible but I don't want him to get back to where we were on 1 Feb. He is not coughing - or I haven't heard him cough when I have been with him. He has used his emergency inhaler once yesterday and once today. He did a nebulizer treatment last night and this morning and I advised him to do a treatment tonight if he is still wheezing. Is there anything else that we should be doing? ?Thank you! ?Cory Morris ?Jeff's Daughter" ? ?Dr. Vaughan Browner, can you please advise? Thanks!  ?

## 2021-11-02 NOTE — Telephone Encounter (Signed)
Patient seen as an acute visit.  Nothing further needed. ?

## 2021-12-29 ENCOUNTER — Ambulatory Visit: Payer: Medicare Other | Admitting: Pulmonary Disease

## 2022-01-23 ENCOUNTER — Ambulatory Visit
Admission: RE | Admit: 2022-01-23 | Discharge: 2022-01-23 | Disposition: A | Discharge status: Home / Self Care | Payer: Medicare Other | Source: Ambulatory Visit | Attending: Pulmonary Disease | Admitting: Pulmonary Disease

## 2022-01-23 DIAGNOSIS — J849 Interstitial pulmonary disease, unspecified: Secondary | ICD-10-CM

## 2022-01-31 ENCOUNTER — Encounter: Payer: Self-pay | Admitting: Pulmonary Disease

## 2022-01-31 ENCOUNTER — Ambulatory Visit (INDEPENDENT_AMBULATORY_CARE_PROVIDER_SITE_OTHER): Payer: Medicare Other | Admitting: Pulmonary Disease

## 2022-01-31 VITALS — BP 128/60 | HR 66 | Temp 97.6°F | Ht 67.0 in | Wt 171.2 lb

## 2022-01-31 DIAGNOSIS — J849 Interstitial pulmonary disease, unspecified: Secondary | ICD-10-CM | POA: Diagnosis not present

## 2022-01-31 DIAGNOSIS — J449 Chronic obstructive pulmonary disease, unspecified: Secondary | ICD-10-CM | POA: Diagnosis not present

## 2022-01-31 DIAGNOSIS — J439 Emphysema, unspecified: Secondary | ICD-10-CM

## 2022-01-31 NOTE — Progress Notes (Addendum)
Cory Morris    [id number]    1941/03/25  Primary Care Physician:Reddy, Jama Flavors, MD  Referring Physician: Margretta Sidle, [place],  [place] [address]  Chief complaint:   Follow up for COPD  HPI: 81 year old with history of chronic kidney disease stage III, hypertension, COPD, hyperlipidemia, gout, atrial fibrillation, HFpEF.  Referred for evaluation of COPD.  He is moved here recently from [place] to be closer to family.  Previously followed by Dr. Candelaria Celeste at [facility] clinic.  Maintained on anoro which has not made any difference with his breathing. He is relatively sedentary and denies any dyspnea, cough, mucus production, wheezing.   Off anoro since December 2021 per patient preference due to cost of medication  Pets: No pets Occupation: Retired Retail buyer Exposures: No exposure to asbestos.  No mold, hot tub, Jacuzzi.  No feather pillows or comforters Smoking history: 60-pack-year smoker.  Quit in April 2021 Travel history: From [place].  No significant recent travel Relevant family history: No significant family history of lung disease  Interim history: He had a chest x-ray done earlier this year which showed mild interstitial changes and a follow-up CT high-resolution.  He is here to discuss results   Outpatient Encounter Medications as of 01/31/2022  Medication Sig   albuterol (VENTOLIN HFA) 108 (90 Base) MCG/ACT inhaler Inhale 2 puffs into the lungs every 6 (six) hours as needed for wheezing or shortness of breath.   allopurinol (ZYLOPRIM) 100 MG tablet Take 100 mg by mouth.   Ascorbic Acid (VITAMIN C WITH ROSE HIPS) 1000 MG tablet Take 1,000 mg by mouth daily.   atorvastatin (LIPITOR) 80 MG tablet Take 80 mg by mouth daily.   cetirizine (ZYRTEC) 10 MG tablet Take 10 mg by mouth daily.   cholecalciferol (VITAMIN D3) 25 MCG (1000 UNIT) tablet Take 2,000 Units by mouth daily.   diltiazem (CARDIZEM CD) 120 MG  24 hr capsule Take 120 mg by mouth daily.   ELIQUIS 2.5 MG TABS tablet Take 2.5 mg by mouth 2 (two) times daily.   Fluticasone-Umeclidin-Vilant (TRELEGY ELLIPTA) 200-62.5-25 MCG/ACT AEPB Inhale 1 puff into the lungs daily.   ipratropium-albuterol (DUONEB) 0.5-2.5 (3) MG/3ML SOLN Take 3 mLs by nebulization every 6 (six) hours as needed.   latanoprost (XALATAN) 0.005 % ophthalmic solution 1 drop at bedtime.   metoprolol tartrate (LOPRESSOR) 100 MG tablet Take 100 mg by mouth 2 (two) times daily.   [DISCONTINUED] azithromycin (ZITHROMAX) 250 MG tablet Take as directed   [DISCONTINUED] predniSONE (DELTASONE) 20 MG tablet Take 69m for 5 days   [DISCONTINUED] sertraline (ZOLOFT) 50 MG tablet Take 50 mg by mouth daily.   [DISCONTINUED] torsemide (DEMADEX) 20 MG tablet Take 20 mg by mouth daily. Monday Wednesday Friday   No facility-administered encounter medications on file as of 01/31/2022.    Physical Exam: Blood pressure 116/60, pulse 81, temperature 97.6 F (36.4 C), temperature source Oral, height 5' 7" (1.702 m), weight 163 lb 12.8 oz (74.3 kg), SpO2 95 %. Gen:      No acute distress HEENT:  EOMI, sclera anicteric Neck:     No masses; no thyromegaly Lungs:    Scattered expiratory wheeze, crackle CV:         Regular rate and rhythm; no murmurs Abd:      + bowel sounds; soft, non-tender; no palpable masses, no distension Ext:    No edema; adequate peripheral perfusion Skin:      Warm and  dry; no rash Neuro: alert and oriented x 3 Psych: normal mood and affect   Data Reviewed: Imaging: Chest x-ray 07/22/2020-small bilateral effusions, basal atelectasis, hyperinflation Chest x-ray 09/13/2021-chronic interstitial changes CT high-resolution 01/23/2022-subtle changes of patchy groundglass attenuation and mild septal thickening. I have reviewed the images personally.  PFTs: 10/20/2020 FVC 2.42 [71%], FEV1 1.19 [50%], F/F 49, TLC 6.27 [100%], DLCO 9.67 [45%] Severe obstruction, diffusion  defect with air trapping and bronchodilator response  Labs: Labs from primary care CBC 09/23/2020-WBC 7.58, eos 4.2% absolute eosinophil count 320 CBC 06/16/2020- WBC 8.67, eos 6%, absolute eosinophil count 520 CMP 09/23/2020 significant for alk phos 160, BUN 55, creatinine 2.19  CBC 09/25/2021-WBC 10.8, eos 2.4%, absolute eosinophil count 259 IgE 09/13/2021-275 Alpha-1 antitrypsin 09/13/2021-240, PI MM  Assessment:  Abnormal CT High-res CT shows mild nonspecific changes of patchy attenuation and mild septal thickening.  It is unclear to me if this is significant interstitial lung disease Get baseline CTD serologies and hypersensitivity panel.  Follow-up high-res CT ordered for 6 months  COPD, asthma Continue Trelegy inhaler He does have bronchodilator response, elevated peripheral eosinophils and IgE suggestive of COPD, asthma overlap syndrome  Plan/Recommendations: ILD panel Follow-up CT in 6 months  Marshell Garfinkel MD [facility] none 01/31/2022, 4:14 PM  CC: Margretta Sidle, MD

## 2022-01-31 NOTE — Patient Instructions (Signed)
We will get some labs for interstitial lung disease and sensitivity panel Order follow-up high-res CT in 6 months Return to clinic in 3 months.

## 2022-02-02 LAB — CYCLIC CITRUL PEPTIDE ANTIBODY, IGG: Cyclic Citrullin Peptide Ab: <16 UNITS

## 2022-02-02 LAB — ANTI-DNA ANTIBODY, DOUBLE-STRANDED: ds DNA Ab: 1 IU/mL

## 2022-02-02 LAB — RHEUMATOID FACTOR: Rheumatoid fact SerPl-aCnc: 14 IU/mL — ABNORMAL HIGH (ref <14)

## 2022-02-02 LAB — ANA: Anti Nuclear Antibody (ANA): NEGATIVE

## 2022-02-28 DIAGNOSIS — C3490 Malignant neoplasm of unspecified part of unspecified bronchus or lung: Secondary | ICD-10-CM | POA: Insufficient documentation

## 2022-03-08 ENCOUNTER — Other Ambulatory Visit: Payer: Self-pay | Admitting: Pulmonary Disease

## 2022-03-29 ENCOUNTER — Telehealth: Payer: Self-pay | Admitting: Pulmonary Disease

## 2022-03-29 NOTE — Telephone Encounter (Signed)
Doctors office called [place] to let us know that daughter is concerned about patient taking their emergency inhaler multiple times a day and several times during the night. Looks like patient is on Albuterol inhaler.   Do you want to give the patient a different inhaelr to help him during the day?  Please advise sir

## 2022-03-30 NOTE — Telephone Encounter (Signed)
Can we call to schedule follow up with Dr Vaughan Browner.   Thank you

## 2022-03-30 NOTE — Telephone Encounter (Signed)
Appt scheduled for 04/02/22 with Mannam

## 2022-03-30 NOTE — Telephone Encounter (Signed)
He is already in trelegy inhaler. Please make an appointment with me or APP sonner to assess if his breathing is worse

## 2022-04-02 ENCOUNTER — Encounter: Payer: Self-pay | Admitting: Pulmonary Disease

## 2022-04-02 ENCOUNTER — Ambulatory Visit (INDEPENDENT_AMBULATORY_CARE_PROVIDER_SITE_OTHER): Payer: Medicare Other | Admitting: Pulmonary Disease

## 2022-04-02 VITALS — BP 118/68 | HR 88 | Temp 97.6°F | Ht 67.0 in | Wt 174.2 lb

## 2022-04-02 DIAGNOSIS — J449 Chronic obstructive pulmonary disease, unspecified: Secondary | ICD-10-CM

## 2022-04-02 DIAGNOSIS — J849 Interstitial pulmonary disease, unspecified: Secondary | ICD-10-CM | POA: Diagnosis not present

## 2022-04-02 NOTE — Patient Instructions (Signed)
Continue Trelegy inhaler and rescue medications as prescribed Continue to monitor symptoms Follow-up in December or early January 2024 after CT scan

## 2022-04-02 NOTE — Progress Notes (Signed)
Cory Morris    [id number]    1940/12/29  Primary Care Physician:Reddy, Jama Flavors, MD  Referring Physician: Margretta Sidle, [place],  [place] [address]  Chief complaint:   Follow up for COPD  HPI: 81 year old with history of chronic kidney disease stage III, hypertension, COPD, hyperlipidemia, gout, atrial fibrillation, HFpEF.  Referred for evaluation of COPD.  He is moved here recently from [place] to be closer to family.  Previously followed by Dr. Candelaria Celeste at [facility] clinic.  Maintained on anoro which has not made any difference with his breathing. He is relatively sedentary and denies any dyspnea, cough, mucus production, wheezing.   Off anoro since December 2021 per patient preference due to cost of medication  Pets: No pets Occupation: Retired Retail buyer Exposures: No exposure to asbestos.  No mold, hot tub, Jacuzzi.  No feather pillows or comforters Smoking history: 60-pack-year smoker.  Quit in April 2021 Travel history: From [place].  No significant recent travel Relevant family history: No significant family history of lung disease  Interim history: Continues to have dyspnea on exertion.  Daughter notes that he has increasing episodes of wheezing at night requiring use of albuterol inhaler and nebulizer.  Outpatient Encounter Medications as of 04/02/2022  Medication Sig   albuterol (VENTOLIN HFA) 108 (90 Base) MCG/ACT inhaler Inhale 2 puffs into the lungs every 6 (six) hours as needed for wheezing or shortness of breath.   allopurinol (ZYLOPRIM) 100 MG tablet Take 100 mg by mouth.   Ascorbic Acid (VITAMIN C WITH ROSE HIPS) 1000 MG tablet Take 1,000 mg by mouth daily.   atorvastatin (LIPITOR) 80 MG tablet Take 80 mg by mouth daily.   cetirizine (ZYRTEC) 10 MG tablet Take 10 mg by mouth daily.   cholecalciferol (VITAMIN D3) 25 MCG (1000 UNIT) tablet Take 2,000 Units by mouth daily.   diltiazem (CARDIZEM CD)  120 MG 24 hr capsule Take 120 mg by mouth daily.   ELIQUIS 2.5 MG TABS tablet Take 2.5 mg by mouth 2 (two) times daily.   ipratropium-albuterol (DUONEB) 0.5-2.5 (3) MG/3ML SOLN Take 3 mLs by nebulization every 6 (six) hours as needed.   latanoprost (XALATAN) 0.005 % ophthalmic solution 1 drop at bedtime.   metoprolol tartrate (LOPRESSOR) 100 MG tablet Take 100 mg by mouth 2 (two) times daily.   torsemide (DEMADEX) 10 MG tablet Take by mouth. Taking M,W,F   TRELEGY ELLIPTA 200-62.5-25 MCG/ACT AEPB INHALE ONE PUFF BY MOUTH INTO LUNGS DAILY   No facility-administered encounter medications on file as of 04/02/2022.    Physical Exam: Blood pressure 118/68, pulse 88, temperature 97.6 F (36.4 C), temperature source Oral, height $RemoveBefo'5\' 7"'TnHAuMbRwGX$  (1.702 m), weight 174 lb 3.2 oz (79 kg), SpO2 94 %. Gen:      No acute distress HEENT:  EOMI, sclera anicteric Neck:     No masses; no thyromegaly Lungs:    Clear to auscultation bilaterally; normal respiratory effort CV:         Regular rate and rhythm; no murmurs Abd:      + bowel sounds; soft, non-tender; no palpable masses, no distension Ext:    No edema; adequate peripheral perfusion Skin:      Warm and dry; no rash Neuro: alert and oriented x 3 Psych: normal mood and affect   Data Reviewed: Imaging: Chest x-ray 07/22/2020-small bilateral effusions, basal atelectasis, hyperinflation Chest x-ray 09/13/2021-chronic interstitial changes CT high-resolution 01/23/2022-subtle changes of patchy groundglass attenuation and mild  septal thickening. I have reviewed the images personally.  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  It is unclear to me if this is significant interstitial lung disease CTD serologies are unremarkable.  Follow-up high-res CT ordered for 6 months  COPD, asthma Continue Trelegy inhaler He does have bronchodilator response, elevated peripheral eosinophils and IgE suggestive of COPD, asthma overlap syndrome  With nighttime awakenings we discussed escalation of therapy with Singulair or Dupixent but they prefer to wait and not add additional medication.  Reassess at return visit.  Plan/Recommendations: Follow-up CT Return to clinic in 4 months  Marshell Garfinkel MD [facility] none 04/02/2022, 1:46 PM  CC: Margretta Sidle, MD

## 2022-04-24 ENCOUNTER — Other Ambulatory Visit: Payer: Self-pay | Admitting: Nephrology

## 2022-04-24 DIAGNOSIS — N1832 Chronic kidney disease, stage 3b: Secondary | ICD-10-CM

## 2022-04-27 ENCOUNTER — Ambulatory Visit: Payer: Medicare Other | Admitting: Pulmonary Disease

## 2022-04-30 ENCOUNTER — Ambulatory Visit
Admission: RE | Admit: 2022-04-30 | Discharge: 2022-04-30 | Disposition: A | Discharge status: Home / Self Care | Payer: Medicare Other | Source: Ambulatory Visit | Attending: Nephrology | Admitting: Nephrology

## 2022-04-30 DIAGNOSIS — N1832 Chronic kidney disease, stage 3b: Secondary | ICD-10-CM

## 2022-06-05 ENCOUNTER — Other Ambulatory Visit: Payer: Self-pay | Admitting: Pulmonary Disease

## 2022-07-25 ENCOUNTER — Other Ambulatory Visit: Payer: Medicare Other

## 2022-07-25 ENCOUNTER — Ambulatory Visit
Admission: RE | Admit: 2022-07-25 | Discharge: 2022-07-25 | Disposition: A | Discharge status: Home / Self Care | Payer: Medicare Other | Source: Ambulatory Visit | Attending: Pulmonary Disease | Admitting: Pulmonary Disease

## 2022-07-25 DIAGNOSIS — J849 Interstitial pulmonary disease, unspecified: Secondary | ICD-10-CM

## 2022-08-03 ENCOUNTER — Ambulatory Visit: Payer: Medicare Other | Admitting: Internal Medicine

## 2022-08-09 DIAGNOSIS — N2581 Secondary hyperparathyroidism of renal origin: Secondary | ICD-10-CM | POA: Insufficient documentation

## 2022-08-09 DIAGNOSIS — D631 Anemia in chronic kidney disease: Secondary | ICD-10-CM | POA: Insufficient documentation

## 2022-08-09 DIAGNOSIS — N189 Chronic kidney disease, unspecified: Secondary | ICD-10-CM | POA: Insufficient documentation

## 2022-08-14 DIAGNOSIS — M7022 Olecranon bursitis, left elbow: Secondary | ICD-10-CM | POA: Insufficient documentation

## 2022-08-22 ENCOUNTER — Other Ambulatory Visit: Payer: Self-pay | Admitting: *Deleted

## 2022-08-22 DIAGNOSIS — J849 Interstitial pulmonary disease, unspecified: Secondary | ICD-10-CM

## 2022-08-22 DIAGNOSIS — R911 Solitary pulmonary nodule: Secondary | ICD-10-CM

## 2022-09-05 ENCOUNTER — Other Ambulatory Visit: Payer: Self-pay | Admitting: Pulmonary Disease

## 2022-09-10 ENCOUNTER — Encounter: Payer: Self-pay | Admitting: Nurse Practitioner

## 2022-09-10 ENCOUNTER — Ambulatory Visit: Payer: Medicare Other | Attending: Internal Medicine | Admitting: Nurse Practitioner

## 2022-09-10 VITALS — BP 100/70 | HR 79 | Ht 67.0 in | Wt 174.0 lb

## 2022-09-10 DIAGNOSIS — N184 Chronic kidney disease, stage 4 (severe): Secondary | ICD-10-CM

## 2022-09-10 DIAGNOSIS — J438 Other emphysema: Secondary | ICD-10-CM

## 2022-09-10 DIAGNOSIS — I700 Atherosclerosis of aorta: Secondary | ICD-10-CM

## 2022-09-10 DIAGNOSIS — R600 Localized edema: Secondary | ICD-10-CM

## 2022-09-10 DIAGNOSIS — I4821 Permanent atrial fibrillation: Secondary | ICD-10-CM | POA: Diagnosis present

## 2022-09-10 NOTE — Patient Instructions (Signed)
Medication Instructions:  No changes *If you need a refill on your cardiac medications before your next appointment, please call your pharmacy*  Follow-Up: At [facility], you and your health needs are our priority.  As part of our continuing mission to provide you with exceptional heart care, we have created designated Provider Care Teams.  These Care Teams include your primary Cardiologist (physician) and Advanced Practice Providers (APPs -  Physician Assistants and Nurse Practitioners) who all work together to provide you with the care you need, when you need it.  We recommend signing up for the patient portal called "MyChart".  Sign up information is provided on this After Visit Summary.  MyChart is used to connect with patients for Virtual Visits (Telemedicine).  Patients are able to view lab/test results, encounter notes, upcoming appointments, etc.  Non-urgent messages can be sent to your provider as well.   To learn more about what you can do with MyChart, go to [email or web address].    Your next appointment:   1 year(s)  Provider:   Pixie Casino, MD

## 2022-09-10 NOTE — Progress Notes (Signed)
Office Visit    Patient Name: Cory Morris Date of Encounter: 09/10/2022  Primary Care Provider:  Margretta Sidle, MD Primary Cardiologist:  Pixie Casino, MD  Chief Complaint    82 year old male with a history of permanent atrial fibrillation, aortic atherosclerosis, CKD stage IV, and COPD who presents for follow-up related to atrial fibrillation.  Past Medical History    Past Medical History:  Diagnosis Date   Aortic atherosclerosis (HCC)    CKD (chronic kidney disease) stage 4, GFR 15-29 ml/min (HCC)    COPD (chronic obstructive pulmonary disease) (HCC)    Gout    Permanent atrial fibrillation (HCC)    Prediabetes    Past Surgical History:  Procedure Laterality Date   ATRIAL FIBRILLATION ABLATION      Allergies  Allergies  Allergen Reactions   Ace Inhibitors    Dronedarone      Labs/Other Studies Reviewed   Recent Labs: 09/13/2021: ALT 8; BUN 42; Creatinine, Ser 2.26; Potassium 4.6; Sodium 140 09/25/2021: Hemoglobin 12.5; Platelets 201.0  Recent Lipid Panel No results found for: "CHOL", "TRIG", "HDL", "CHOLHDL", "VLDL", "LDLCALC", "LDLDIRECT"  History of Present Illness    82 year old male with the above past medical history including permanent atrial fibrillation, aortic atherosclerosis, CKD stage IV, and COPD.  He is originally from the [place] area.  He previously followed with cardiology at the [facility] clinic.  He moved to [place] in 2021 and establish care with Dr. Debara Pickett in 2022. He has a history of atrial fibrillation s/p multiple prior cardioversions and ablation.  Additionally, he has a history of CKD, follows with nephrology.  He has a history of COPD and follows with pulmonology.  He was last seen in office on 07/10/2021 and was stable from a cardiac standpoint.   He presents today for follow-up accompanied by his daughter.  Since his last visit he has been stable overall from a cardiac point.  He saw his nephrologist in December who  increased his torsemide to 10 mg daily in the setting of increased lower extremity edema.  He denies any chest pain, palpitations, worsening dyspnea, PND, orthopnea, weight gain.  He has baseline borderline low BP.  Per patient's daughter, he was mildly orthostatic with his nephrologist in December 2023.  He has had issues with balance and did suffer a fall in November 2023.  He denies any falls since, denies bleeding.  Overall, he has been stable.   Home Medications    Current Outpatient Medications  Medication Sig Dispense Refill   albuterol (VENTOLIN HFA) 108 (90 Base) MCG/ACT inhaler Inhale 2 puffs into the lungs every 6 (six) hours as needed for wheezing or shortness of breath. 8 g 5   allopurinol (ZYLOPRIM) 100 MG tablet Take 100 mg by mouth.     Ascorbic Acid (VITAMIN C WITH ROSE HIPS) 1000 MG tablet Take 1,000 mg by mouth daily.     atorvastatin (LIPITOR) 80 MG tablet Take 80 mg by mouth daily.     cetirizine (ZYRTEC) 10 MG tablet Take 10 mg by mouth daily as needed.     cholecalciferol (VITAMIN D3) 25 MCG (1000 UNIT) tablet Take 2,000 Units by mouth daily.     diltiazem (CARDIZEM CD) 120 MG 24 hr capsule Take 120 mg by mouth daily.     ELIQUIS 2.5 MG TABS tablet Take 2.5 mg by mouth 2 (two) times daily.     Fluticasone-Umeclidin-Vilant (TRELEGY ELLIPTA) 200-62.5-25 MCG/ACT AEPB INHALE 1 PUFF BY MOUTH DAILY 60 each 1  ipratropium-albuterol (DUONEB) 0.5-2.5 (3) MG/3ML SOLN Take 3 mLs by nebulization every 6 (six) hours as needed. 360 mL 5   latanoprost (XALATAN) 0.005 % ophthalmic solution 1 drop at bedtime.     metoprolol tartrate (LOPRESSOR) 100 MG tablet Take 100 mg by mouth 2 (two) times daily.     montelukast (SINGULAIR) 10 MG tablet Take 10 mg by mouth daily.     torsemide (DEMADEX) 10 MG tablet Take by mouth. Taking M,W,F     No current facility-administered medications for this visit.     Review of Systems    He denies chest pain, palpitations, dyspnea, pnd, orthopnea, n, v,  dizziness, syncope, weight gain, or early satiety. All other systems reviewed and are otherwise negative except as noted above.   Physical Exam    VS:  BP 100/70   Pulse 79   Ht 5\' 7"  (1.702 m)   Wt 174 lb (78.9 kg)   SpO2 94%   BMI 27.25 kg/m  GEN: Well nourished, well developed, in no acute distress. HEENT: normal. Neck: Supple, no JVD, carotid bruits, or masses. Cardiac: IRIR, no murmurs, rubs, or gallops. No clubbing, cyanosis, nonpitting bilateral lower extremity edema.  Radials/DP/PT 2+ and equal bilaterally.  Respiratory:  Respirations regular and unlabored, clear to auscultation bilaterally. GI: Soft, nontender, nondistended, BS + x 4. MS: no deformity or atrophy. Skin: warm and dry, no rash. Neuro:  Strength and sensation are intact. Psych: Normal affect.  Accessory Clinical Findings    ECG personally reviewed by me today -atrial fibrillation, 79 bpm, RBBB- no acute changes.   Lab Results  Component Value Date   WBC 10.8 (H) 09/25/2021   HGB 12.5 (L) 09/25/2021   HCT 39.4 09/25/2021   MCV 88.2 09/25/2021   PLT 201.0 09/25/2021   Lab Results  Component Value Date   CREATININE 2.26 (H) 09/13/2021   BUN 42 (H) 09/13/2021   NA 140 09/13/2021   K 4.6 09/13/2021   CL 96 09/13/2021   CO2 33 (H) 09/13/2021   Lab Results  Component Value Date   ALT 8 09/13/2021   AST 13 09/13/2021   ALKPHOS 127 (H) 09/13/2021   BILITOT 0.6 09/13/2021   No results found for: "CHOL", "HDL", "LDLCALC", "LDLDIRECT", "TRIG", "CHOLHDL"  No results found for: "HGBA1C"  Assessment & Plan    1. Permanent atrial fibrillation: Rate controlled.  He has had borderline low BP, mild orthostasis, and had a mechanical fall in 06/2022.  Discussed benefits versus risks of continuing blood thinner given history of falls.  Through shared decision-making, patient and daughter prefer to continue Eliquis for now. Discussed ED precautions.  Continue diltiazem, metoprolol, Eliquis at reduced dose.  2.   Bilateral lower extremity edema: Torsemide was increased to 10 mg daily per nephrology in December 2023.  He does have nonpitting bilateral lower extremity edema, otherwise, euvolemic and well compensated on exam.  Appreciate nephrology's assistance in managing diuretic therapy.  3. Aortic atherosclerosis: Noted on prior CT. Continue Lipitor.  4. CKD stage IV: Creatinine was 2.45 in 05/2022.  Follows with nephrology.  5. COPD: He notes stable chronic dyspnea.  Follows with pulmonology.  6. Disposition: Follow-up in 1 year.     Lenna Sciara, NP 09/10/2022, 4:34 PM

## 2022-09-19 ENCOUNTER — Other Ambulatory Visit: Payer: Self-pay | Admitting: Pulmonary Disease

## 2022-10-17 ENCOUNTER — Ambulatory Visit
Admission: RE | Admit: 2022-10-17 | Discharge: 2022-10-17 | Disposition: A | Discharge status: Home / Self Care | Payer: Medicare Other | Source: Ambulatory Visit | Attending: Pulmonary Disease | Admitting: Pulmonary Disease

## 2022-10-17 DIAGNOSIS — R911 Solitary pulmonary nodule: Secondary | ICD-10-CM

## 2022-10-17 DIAGNOSIS — J849 Interstitial pulmonary disease, unspecified: Secondary | ICD-10-CM

## 2022-10-23 ENCOUNTER — Other Ambulatory Visit: Payer: Self-pay | Admitting: *Deleted

## 2022-10-23 DIAGNOSIS — R911 Solitary pulmonary nodule: Secondary | ICD-10-CM

## 2022-10-23 MED ORDER — AZITHROMYCIN 250 MG PO TABS: ORAL_TABLET | ORAL | 0 refills | Status: DC

## 2022-11-08 ENCOUNTER — Other Ambulatory Visit: Payer: Self-pay | Admitting: Pulmonary Disease

## 2022-11-08 ENCOUNTER — Other Ambulatory Visit (HOSPITAL_COMMUNITY): Payer: Medicare Other

## 2022-11-23 DIAGNOSIS — J84112 Idiopathic pulmonary fibrosis: Secondary | ICD-10-CM | POA: Insufficient documentation

## 2022-11-27 ENCOUNTER — Encounter (HOSPITAL_COMMUNITY)
Admission: RE | Admit: 2022-11-27 | Discharge: 2022-11-27 | Disposition: A | Discharge status: Home / Self Care | Payer: Medicare Other | Source: Ambulatory Visit | Attending: Pulmonary Disease | Admitting: Pulmonary Disease

## 2022-11-27 DIAGNOSIS — R911 Solitary pulmonary nodule: Secondary | ICD-10-CM | POA: Diagnosis present

## 2022-11-27 LAB — GLUCOSE, CAPILLARY: Glucose-Capillary: 112 mg/dL — ABNORMAL HIGH (ref 70–99)

## 2022-11-27 MED ORDER — FLUDEOXYGLUCOSE F - 18 (FDG) INJECTION
8.6300 | Freq: Once | INTRAVENOUS | Status: AC | PRN
  Administered 2022-11-27: 8.63 via INTRAVENOUS

## 2022-12-10 ENCOUNTER — Ambulatory Visit (INDEPENDENT_AMBULATORY_CARE_PROVIDER_SITE_OTHER): Payer: Medicare Other | Admitting: Pulmonary Disease

## 2022-12-10 ENCOUNTER — Encounter: Payer: Self-pay | Admitting: Pulmonary Disease

## 2022-12-10 VITALS — BP 100/60 | HR 71 | Ht 67.0 in | Wt 181.2 lb

## 2022-12-10 DIAGNOSIS — R942 Abnormal results of pulmonary function studies: Secondary | ICD-10-CM

## 2022-12-10 DIAGNOSIS — N184 Chronic kidney disease, stage 4 (severe): Secondary | ICD-10-CM

## 2022-12-10 DIAGNOSIS — R911 Solitary pulmonary nodule: Secondary | ICD-10-CM

## 2022-12-10 DIAGNOSIS — J4489 Other specified chronic obstructive pulmonary disease: Secondary | ICD-10-CM | POA: Diagnosis not present

## 2022-12-10 DIAGNOSIS — J439 Emphysema, unspecified: Secondary | ICD-10-CM

## 2022-12-10 NOTE — Patient Instructions (Addendum)
Thank you for visiting Dr. Tonia Brooms at [facility]. Today we recommend the following:  Please let me know when you have seen Dr. [name]   Please do your part to reduce the spread of COVID-19.

## 2022-12-10 NOTE — Progress Notes (Signed)
Synopsis: Referred in April 2024 for lung nodule by Combs, Prince Solian, *  Subjective:   PATIENT ID: Cory Morris GENDER: male DOB: Jan 24, 1941, MRN: [id number]  Chief Complaint  Patient presents with   Consult    Discuss bronch    This is an 82 year old gentleman with chronic kidney disease stage IV, COPD, atrial fibrillation.  Severe cervical and upper thoracic kyphosis.  Followed for COPD management by Dr. Isaiah Serge.  Referred for abnormal CT chest with a left lower lobe pulmonary nodule.  Hypermetabolic on PET imaging concerning for potential underlying malignancy.  Patient was referred for consideration of bronchoscopy.  He does have atrial fibrillation and is on Eliquis.  He has had increase in bilateral lower extremity edema.Recent pet imaging also showed increased development of bilateral pleural effusion.     Past Medical History:  Diagnosis Date   Aortic atherosclerosis (HCC)    CKD (chronic kidney disease) stage 4, GFR 15-29 ml/min (HCC)    COPD (chronic obstructive pulmonary disease) (HCC)    Gout    Permanent atrial fibrillation (HCC)    Prediabetes      Family History  Problem Relation Age of Onset   Atrial fibrillation Neg Hx    Arrhythmia Neg Hx      Past Surgical History:  Procedure Laterality Date   ATRIAL FIBRILLATION ABLATION      Social History   Socioeconomic History   Marital status: Married    Spouse name: Not on file   Number of children: Not on file   Years of education: Not on file   Highest education level: Not on file  Occupational History   Not on file  Tobacco Use   Smoking status: Former    Packs/day: 1.00    Years: 60.00    Additional pack years: 0.00    Total pack years: 60.00    Types: Cigarettes   Smokeless tobacco: Never   Tobacco comments:    quit in April 2021  Substance and Sexual Activity   Alcohol use: Not on file   Drug use: Not on file   Sexual activity: Not on file  Other Topics Concern   Not on file   Social History Narrative   Not on file   Social Determinants of Health   Financial Resource Strain: Not on file  Food Insecurity: Not on file  Transportation Needs: Not on file  Physical Activity: Not on file  Stress: Not on file  Social Connections: Not on file  Intimate Partner Violence: Not on file     Allergies  Allergen Reactions   Ace Inhibitors    Dronedarone      Outpatient Medications Prior to Visit  Medication Sig Dispense Refill   albuterol (VENTOLIN HFA) 108 (90 Base) MCG/ACT inhaler INHALE TWO PUFFS BY MOUTH INTO LUNGS EVERY 6 HOURS AS NEEDED FOR WHEEZING/SHORTNESS OF BREATH 18 g 5   allopurinol (ZYLOPRIM) 100 MG tablet Take 100 mg by mouth.     Ascorbic Acid (VITAMIN C WITH ROSE HIPS) 1000 MG tablet Take 1,000 mg by mouth daily.     atorvastatin (LIPITOR) 80 MG tablet Take 80 mg by mouth daily.     azithromycin (ZITHROMAX) 250 MG tablet Take as directed 6 tablet 0   cetirizine (ZYRTEC) 10 MG tablet Take 10 mg by mouth daily as needed.     cholecalciferol (VITAMIN D3) 25 MCG (1000 UNIT) tablet Take 2,000 Units by mouth daily.     diltiazem (CARDIZEM CD) 120 MG 24 hr  capsule Take 120 mg by mouth daily.     ELIQUIS 2.5 MG TABS tablet Take 2.5 mg by mouth 2 (two) times daily.     Fluticasone-Umeclidin-Vilant (TRELEGY ELLIPTA) 200-62.5-25 MCG/ACT AEPB INHALE 1 PUFF BY MOUTH INTO LUNGS DAILY 60 each 5   ipratropium-albuterol (DUONEB) 0.5-2.5 (3) MG/3ML SOLN Take 3 mLs by nebulization every 6 (six) hours as needed. 360 mL 5   latanoprost (XALATAN) 0.005 % ophthalmic solution 1 drop at bedtime.     metoprolol tartrate (LOPRESSOR) 100 MG tablet Take 100 mg by mouth 2 (two) times daily.     montelukast (SINGULAIR) 10 MG tablet Take 10 mg by mouth daily.     torsemide (DEMADEX) 10 MG tablet Take 10 mg by mouth. Taking M,W,F 20 MG     No facility-administered medications prior to visit.    Review of Systems  Constitutional:  Negative for chills, fever, malaise/fatigue  and weight loss.  HENT:  Negative for hearing loss, sore throat and tinnitus.   Eyes:  Negative for blurred vision and double vision.  Respiratory:  Positive for shortness of breath. Negative for cough, hemoptysis, sputum production, wheezing and stridor.   Cardiovascular:  Negative for chest pain, palpitations, orthopnea, leg swelling and PND.  Gastrointestinal:  Negative for abdominal pain, constipation, diarrhea, heartburn, nausea and vomiting.  Genitourinary:  Negative for dysuria, hematuria and urgency.  Musculoskeletal:  Negative for joint pain and myalgias.  Skin:  Negative for itching and rash.  Neurological:  Negative for dizziness, tingling, weakness and headaches.  Endo/Heme/Allergies:  Negative for environmental allergies. Does not bruise/bleed easily.  Psychiatric/Behavioral:  Negative for depression. The patient is not nervous/anxious and does not have insomnia.   All other systems reviewed and are negative.    Objective:  Physical Exam Vitals reviewed.  Constitutional:      General: He is not in acute distress.    Appearance: He is well-developed.  HENT:     Head: Normocephalic and atraumatic.  Eyes:     General: No scleral icterus.    Conjunctiva/sclera: Conjunctivae normal.     Pupils: Pupils are equal, round, and reactive to light.  Neck:     Vascular: No JVD.     Trachea: No tracheal deviation.     Comments: Severe thoracic and cervical kyphosis Cardiovascular:     Rate and Rhythm: Normal rate and regular rhythm.     Heart sounds: Normal heart sounds. No murmur heard. Pulmonary:     Effort: Pulmonary effort is normal. No tachypnea, accessory muscle usage or respiratory distress.     Breath sounds: No stridor. No wheezing, rhonchi or rales.  Abdominal:     General: There is no distension.     Palpations: Abdomen is soft.     Tenderness: There is no abdominal tenderness.  Musculoskeletal:        General: No tenderness.     Cervical back: Neck supple.      Right lower leg: Edema present.     Left lower leg: Edema present.  Lymphadenopathy:     Cervical: No cervical adenopathy.  Skin:    General: Skin is warm and dry.     Capillary Refill: Capillary refill takes less than 2 seconds.     Findings: No rash.  Neurological:     Mental Status: He is alert and oriented to person, place, and time.  Psychiatric:        Behavior: Behavior normal.      Vitals:   12/10/22 1532  BP: 100/60  Pulse: 71  SpO2: 95%  Weight: 181 lb 3.2 oz (82.2 kg)  Height: 5\' 7"  (1.702 m)   95% on  LPM  RA BMI Readings from Last 3 Encounters:  12/10/22 28.38 kg/m  09/10/22 27.25 kg/m  04/02/22 27.28 kg/m   Wt Readings from Last 3 Encounters:  12/10/22 181 lb 3.2 oz (82.2 kg)  09/10/22 174 lb (78.9 kg)  04/02/22 174 lb 3.2 oz (79 kg)     CBC    Component Value Date/Time   WBC 10.8 (H) 09/25/2021 1039   RBC 4.46 09/25/2021 1039   HGB 12.5 (L) 09/25/2021 1039   HCT 39.4 09/25/2021 1039   PLT 201.0 09/25/2021 1039   MCV 88.2 09/25/2021 1039   MCHC 31.8 09/25/2021 1039   RDW 16.9 (H) 09/25/2021 1039   LYMPHSABS 1.8 09/25/2021 1039   MONOABS 1.2 (H) 09/25/2021 1039   EOSABS 0.3 09/25/2021 1039   BASOSABS 0.1 09/25/2021 1039      Chest Imaging:  Nuclear medicine pet imaging with a hypermetabolic left lower lobe pulmonary nodule concerning for primary bronchogenic carcinoma.  Also has new bilateral pleural effusion. The patient's images have been independently reviewed by me.    Pulmonary Functions Testing Results:    Latest Ref Rng & Units 10/20/2020    9:02 AM  PFT Results  FVC-Pre L 2.09   FVC-Predicted Pre % 61   FVC-Post L 2.42   FVC-Predicted Post % 71   Pre FEV1/FVC % % 48   Post FEV1/FCV % % 49   FEV1-Pre L 1.01   FEV1-Predicted Pre % 42   FEV1-Post L 1.19   DLCO uncorrected ml/min/mmHg 9.67   DLCO UNC% % 45   DLCO corrected ml/min/mmHg 9.67   DLCO COR %Predicted % 45   DLVA Predicted % 60   TLC L 6.27   TLC % Predicted  % 100   RV % Predicted % 161     FeNO:   Pathology:   Echocardiogram:   Heart Catheterization:     Assessment & Plan:     ICD-10-CM   1. Lung nodule  R91.1     2. Abnormal PET scan of lung  R94.2     3. COPD with chronic bronchitis and emphysema (HCC)  J44.89    J43.9     4. Stage 4 chronic kidney disease (HCC)  N18.4       Discussion:  This is a 82 year old gentleman with chronic kidney disease stage IV.  Recently saw cardiology for follow-up.  He has diastolic heart failure on his echo.  He also has a question of RV dysfunction.  He has chronic kidney disease with fluid retention.  Plan: Cardiac clearance from Dr. Blanchie Dessert office for consideration of general anesthesia. Patient would likely benefit from bronchoscopy and biopsy. We talked about the risk benefits alternatives proceeding in the office today. He will also need to hold his Eliquis prior to the procedure. I think that with his cervical and thoracic kyphosis it may put him at risk for a more difficult intubation. Once patient has an appointment to see Dr. [name] and discuss anything else further to include optimization of his cardiac function. Then we can move forward with scheduling him for bronchoscopy.    Current Outpatient Medications:    albuterol (VENTOLIN HFA) 108 (90 Base) MCG/ACT inhaler, INHALE TWO PUFFS BY MOUTH INTO LUNGS EVERY 6 HOURS AS NEEDED FOR WHEEZING/SHORTNESS OF BREATH, Disp: 18 g, Rfl: 5   allopurinol (ZYLOPRIM) 100  MG tablet, Take 100 mg by mouth., Disp: , Rfl:    Ascorbic Acid (VITAMIN C WITH ROSE HIPS) 1000 MG tablet, Take 1,000 mg by mouth daily., Disp: , Rfl:    atorvastatin (LIPITOR) 80 MG tablet, Take 80 mg by mouth daily., Disp: , Rfl:    azithromycin (ZITHROMAX) 250 MG tablet, Take as directed, Disp: 6 tablet, Rfl: 0   cetirizine (ZYRTEC) 10 MG tablet, Take 10 mg by mouth daily as needed., Disp: , Rfl:    cholecalciferol (VITAMIN D3) 25 MCG (1000 UNIT) tablet, Take 2,000 Units  by mouth daily., Disp: , Rfl:    diltiazem (CARDIZEM CD) 120 MG 24 hr capsule, Take 120 mg by mouth daily., Disp: , Rfl:    ELIQUIS 2.5 MG TABS tablet, Take 2.5 mg by mouth 2 (two) times daily., Disp: , Rfl:    Fluticasone-Umeclidin-Vilant (TRELEGY ELLIPTA) 200-62.5-25 MCG/ACT AEPB, INHALE 1 PUFF BY MOUTH INTO LUNGS DAILY, Disp: 60 each, Rfl: 5   ipratropium-albuterol (DUONEB) 0.5-2.5 (3) MG/3ML SOLN, Take 3 mLs by nebulization every 6 (six) hours as needed., Disp: 360 mL, Rfl: 5   latanoprost (XALATAN) 0.005 % ophthalmic solution, 1 drop at bedtime., Disp: , Rfl:    metoprolol tartrate (LOPRESSOR) 100 MG tablet, Take 100 mg by mouth 2 (two) times daily., Disp: , Rfl:    montelukast (SINGULAIR) 10 MG tablet, Take 10 mg by mouth daily., Disp: , Rfl:    torsemide (DEMADEX) 10 MG tablet, Take 10 mg by mouth. Taking M,W,F 20 MG, Disp: , Rfl:    Josephine Igo, DO [facility] 12/10/2022 4:04 PM

## 2022-12-11 ENCOUNTER — Encounter: Payer: Self-pay | Admitting: Pulmonary Disease

## 2022-12-14 ENCOUNTER — Encounter: Payer: Self-pay | Admitting: Pulmonary Disease

## 2022-12-17 ENCOUNTER — Telehealth: Payer: Self-pay | Admitting: Internal Medicine

## 2022-12-17 NOTE — Telephone Encounter (Signed)
-----   Message from Lindell Spar, RN sent at 12/17/2022 12:48 PM EDT ----- Regarding: RE: needs OV with [facility] or APP There are APP pre-op appts in the next few days.   ----- Message ----- From: Lorie Phenix Sent: 12/17/2022  12:41 PM EDT To: Lindell Spar, RN Subject: RE: needs OV with [facility] or APP                 Unfortunately, I don't have anything sooner.  Dr. Blanchie Dessert DOD day he already has 4 double booked slots.  I did add the patient high priority to the wait list.     ----- Message ----- From: Lindell Spar, RN Sent: 12/17/2022   8:54 AM EDT To: Lorie Phenix Subject: RE: needs OV with [facility] or APP                 I guess if there was anything sooner, that would be good. He needs a pretty important pulmonary procedure done. Thanks   ----- Message ----- From: Lorie Phenix Sent: 12/14/2022   9:37 AM EDT To: Lindell Spar, RN Subject: RE: needs OV with [facility] or APP                 Looks like his daughter called on 4/30 to schedule him a preop eval appt already.  When I went to call him I saw he was scheduled already for 5/31.   ----- Message ----- From: Lindell Spar, RN Sent: 12/13/2022   4:58 PM EDT To: Lorie Phenix Subject: needs OV with [facility] or APP                     Patient needs in-office pre-op eval with [facility] MD or APP (and to optimize heart failure meds)  Thanks

## 2022-12-17 NOTE — Telephone Encounter (Signed)
LVM to move up his 5/31 appt.  Can use one of the preop slots per Eileen Stanford, see notes below.

## 2022-12-18 NOTE — Telephone Encounter (Signed)
Patient's daughter Larita Fife returned call and rescheduled the patient for 05/16 at 8:50 am with [name].

## 2022-12-21 ENCOUNTER — Telehealth: Payer: Self-pay | Admitting: *Deleted

## 2022-12-21 DIAGNOSIS — J849 Interstitial pulmonary disease, unspecified: Secondary | ICD-10-CM

## 2022-12-21 DIAGNOSIS — J4489 Other specified chronic obstructive pulmonary disease: Secondary | ICD-10-CM

## 2022-12-21 MED ORDER — IPRATROPIUM-ALBUTEROL 0.5-2.5 (3) MG/3ML IN SOLN: 3.0000 mL | Freq: Four times a day (QID) | RESPIRATORY_TRACT | 5 refills | Status: DC | PRN

## 2022-12-21 NOTE — Telephone Encounter (Signed)
Called and spoke with patient's daughter, Larita Fife ([place]), she states he is not sick, he just has a cough with his normal mucous related to allergies.  She is just requesting a refill of his albuterol nebulizer solution sent to [facility].  Advised I would send in the refills.  He also needs new tubing for his nebulizer solution.  Advised I would get him established with a DME company.  Nothing further needed.    My bigger concern now is that he is almost out of Albuerol Sulfate for his nebulizer. He does not normally use the nebulizer but he has recently. Can Dr Cruz Condon please call in a perscription to [facility] at [address]? I asked the primary care doc to do it to days ago, but they haven't called it in    His breathing issues ate only at night. He has had to do a nebulizer treatment at night probably 10 times in the past 21 days. But that controls his breathing and he is fine during the day. He sleeps in a recliner so it is not a result of lying flat.     Paige A Powers, CMA  Tinnie Gens Fronck3 days ago    Good afternoon Larita Fife, I apologize for the delay is the shortness with activity or all the time    Francene Castle  P [facility] (supporting Chilton Greathouse, MD)7 days ago   JF Hello! My dad, Kushagra Bigham [birth year] has been having some difficulty breathing. Use of the emergency inhaler and nebulizer have been keeping it from getting critical. His oxygen and BP readings are normal/good. He never took the Zpack that was perscrbed back in Feb. I was wondering if I should have him take it now? Thank you! -Larita Fife

## 2022-12-27 ENCOUNTER — Ambulatory Visit: Payer: Medicare Other | Attending: Adult Health | Admitting: Nurse Practitioner

## 2022-12-27 ENCOUNTER — Encounter: Payer: Self-pay | Admitting: Nurse Practitioner

## 2022-12-27 ENCOUNTER — Telehealth: Payer: Self-pay | Admitting: Pharmacist

## 2022-12-27 VITALS — BP 106/73 | HR 96 | Ht 67.0 in | Wt 177.8 lb

## 2022-12-27 DIAGNOSIS — J438 Other emphysema: Secondary | ICD-10-CM

## 2022-12-27 DIAGNOSIS — I700 Atherosclerosis of aorta: Secondary | ICD-10-CM | POA: Diagnosis not present

## 2022-12-27 DIAGNOSIS — I4821 Permanent atrial fibrillation: Secondary | ICD-10-CM

## 2022-12-27 DIAGNOSIS — Z01810 Encounter for preprocedural cardiovascular examination: Secondary | ICD-10-CM | POA: Diagnosis present

## 2022-12-27 DIAGNOSIS — I5032 Chronic diastolic (congestive) heart failure: Secondary | ICD-10-CM | POA: Diagnosis not present

## 2022-12-27 DIAGNOSIS — N184 Chronic kidney disease, stage 4 (severe): Secondary | ICD-10-CM | POA: Diagnosis not present

## 2022-12-27 DIAGNOSIS — R600 Localized edema: Secondary | ICD-10-CM

## 2022-12-27 NOTE — Telephone Encounter (Signed)
Patient with diagnosis of A Fib on Eliquis for anticoagulation.    Procedure: Bronchoscopy Date of procedure: TBD   CHA2DS2-VASc Score = 3  This indicates a 3.2% annual risk of stroke. The patient's score is based upon: CHF History: 0 HTN History: 0 Diabetes History: 0 Stroke History: 0 Vascular Disease History: 1 Age Score: 2 Gender Score: 0    CrCl 26 ml/min Platelet count 211 on 10/06/21    Per office protocol, patient can hold Eliquis for 2 days prior to procedure.   **This guidance is not considered finalized until pre-operative APP has relayed final recommendations.**

## 2022-12-27 NOTE — Progress Notes (Addendum)
Office Visit    Patient Name: Cory Morris Date of Encounter: 12/27/2022  Primary Care Provider:  Theodis Shove, DO Primary Cardiologist:  Chrystie Nose, MD  Chief Complaint    82 year old male with a history of permanent atrial fibrillation, aortic atherosclerosis, CKD stage IV, lung nodule and COPD who presents for follow-up related to atrial fibrillation and for preoperative cardiac evaluation.   Past Medical History    Past Medical History:  Diagnosis Date   Aortic atherosclerosis (HCC)    CKD (chronic kidney disease) stage 4, GFR 15-29 ml/min (HCC)    COPD (chronic obstructive pulmonary disease) (HCC)    Gout    Permanent atrial fibrillation (HCC)    Prediabetes    Past Surgical History:  Procedure Laterality Date   ATRIAL FIBRILLATION ABLATION      Allergies  Allergies  Allergen Reactions   Ace Inhibitors    Dronedarone      Labs/Other Studies Reviewed    The following studies were reviewed today: None  Recent Labs: No results found for requested labs within last 365 days.  Recent Lipid Panel No results found for: "CHOL", "TRIG", "HDL", "CHOLHDL", "VLDL", "LDLCALC", "LDLDIRECT"  History of Present Illness    82 year old male with the above past medical history including permanent atrial fibrillation, aortic atherosclerosis, CKD stage IV, and COPD.   He is originally from the [place] area.  He previously followed with cardiology at the [facility] clinic.  He moved to [place] in 2021 and establish care with Dr. [name] in 2022. He has a history of atrial fibrillation s/p multiple prior cardioversions and ablation.  Documented previous diastolic heart failure. Additionally, he has a history of CKD, follows with nephrology.  He has a history of COPD and follows with pulmonology.  He was last seen in office on 09/10/2022 and was stable from a cardiac standpoint.  He had an abnormal CT chest with left lower lobe pulmonary nodule, concerning for  potential underlying malignancy on PET imaging. He is now pending bronchoscopy.   He presents today for follow-up accompanied by his daughter, and for preoperative cardiac evaluation for bronchoscopy. Since his last visit he has been stable from a cardiac standpoint. His nephrologist recently increased his torsemide to 40 mg every Monday Wednesday Friday.  He has noted some improvement in his lower extremity edema with this change.  He has stable chronic dyspnea.  He denies any worsening dyspnea, denies chest pain, palpitations, PND, orthopnea, weight gain.  Overall, his symptoms have been stable.  Home Medications    Current Outpatient Medications  Medication Sig Dispense Refill   albuterol (VENTOLIN HFA) 108 (90 Base) MCG/ACT inhaler INHALE TWO PUFFS BY MOUTH INTO LUNGS EVERY 6 HOURS AS NEEDED FOR WHEEZING/SHORTNESS OF BREATH 18 g 5   allopurinol (ZYLOPRIM) 100 MG tablet Take 100 mg by mouth.     Ascorbic Acid (VITAMIN C WITH ROSE HIPS) 1000 MG tablet Take 1,000 mg by mouth daily.     atorvastatin (LIPITOR) 80 MG tablet Take 80 mg by mouth daily.     azithromycin (ZITHROMAX) 250 MG tablet Take as directed 6 tablet 0   cetirizine (ZYRTEC) 10 MG tablet Take 10 mg by mouth daily as needed.     cholecalciferol (VITAMIN D3) 25 MCG (1000 UNIT) tablet Take 2,000 Units by mouth daily.     diltiazem (CARDIZEM CD) 120 MG 24 hr capsule Take 120 mg by mouth daily.     ELIQUIS 2.5 MG TABS tablet Take 2.5 mg  by mouth 2 (two) times daily.     Fluticasone-Umeclidin-Vilant (TRELEGY ELLIPTA) 200-62.5-25 MCG/ACT AEPB INHALE 1 PUFF BY MOUTH INTO LUNGS DAILY 60 each 5   ipratropium-albuterol (DUONEB) 0.5-2.5 (3) MG/3ML SOLN Take 3 mLs by nebulization every 6 (six) hours as needed. 360 mL 5   latanoprost (XALATAN) 0.005 % ophthalmic solution 1 drop at bedtime.     metoprolol tartrate (LOPRESSOR) 100 MG tablet Take 100 mg by mouth 2 (two) times daily.     montelukast (SINGULAIR) 10 MG tablet Take 10 mg by mouth  daily.     torsemide (DEMADEX) 10 MG tablet Take 10 mg by mouth. Taking M,W,F 20 MG     No current facility-administered medications for this visit.     Review of Systems    He denies chest pain, palpitations, pnd, orthopnea, n, v, dizziness, syncop, weight gain, or early satiety. All other systems reviewed and are otherwise negative except as noted above.   Physical Exam    VS:  BP 106/73 (BP Location: Right Arm, Patient Position: Sitting, Cuff Size: Normal)   Pulse 96   Ht 5\' 7"  (1.702 m)   Wt 177 lb 12.8 oz (80.6 kg)   SpO2 99%   BMI 27.85 kg/m   GEN: Well nourished, well developed, in no acute distress. HEENT: normal. Neck: Supple, no JVD, carotid bruits, or masses. Cardiac: IRIR, no murmurs, rubs, or gallops. No clubbing, cyanosis, nonpitting bilateral lower extremity edema.  Radials/DP/PT 2+ and equal bilaterally.  Respiratory:  Respirations regular and unlabored, clear to auscultation bilaterally. GI: Soft, nontender, nondistended, BS + x 4. MS: no deformity or atrophy. Skin: warm and dry, no rash. Neuro:  Strength and sensation are intact. Psych: Normal affect.  Accessory Clinical Findings    ECG personally reviewed by me today -atrial fibrillation, 96 bpm, RBBB- no acute changes.   Lab Results  Component Value Date   WBC 10.8 (H) 09/25/2021   HGB 12.5 (L) 09/25/2021   HCT 39.4 09/25/2021   MCV 88.2 09/25/2021   PLT 201.0 09/25/2021   Lab Results  Component Value Date   CREATININE 2.26 (H) 09/13/2021   BUN 42 (H) 09/13/2021   NA 140 09/13/2021   K 4.6 09/13/2021   CL 96 09/13/2021   CO2 33 (H) 09/13/2021   Lab Results  Component Value Date   ALT 8 09/13/2021   AST 13 09/13/2021   ALKPHOS 127 (H) 09/13/2021   BILITOT 0.6 09/13/2021   No results found for: "CHOL", "HDL", "LDLCALC", "LDLDIRECT", "TRIG", "CHOLHDL"  No results found for: "HGBA1C"  Assessment & Plan    1. Permanent atrial fibrillation: Rate controlled. Continue diltiazem, metoprolol,  Eliquis at reduced dose.   2.  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  He has stable nonpitting bilateral lower extremity edema, mild abdominal fullness, otherwise, well compensated on exam.  Given need for surgical clearance, will update echo.  Appreciate nephrology's assistance in managing diuretic therapy.    3. Aortic atherosclerosis: Noted on prior CT. Continue Lipitor.   4. CKD stage IV: Creatinine was 2.45 in 05/2022.  Follows with nephrology.   5. COPD/lung nodule: He notes stable chronic dyspnea.  Pending bronchoscopy.  Follows with pulmonology.  6. Preoperative cardiac evaluation: According to the Revised Cardiac Risk Index (RCRI), his Perioperative Risk of Major Cardiac Event is (%): 0.9. His Functional Capacity in METs is: 3.97 according to the Duke Activity Status Index (DASI).  He  is unable to complete 4 METS at baseline.  Discussed with Dr. [name], primary cardiologist.  Patient is not a candidate for ischemic evaluation (CT, stress test, cath) given CKD stage IV, COPD.  Per Dr. [name], if echo stable, he should be at reasonable risk to proceed with bronchoscopy. Per office protocol, patient can hold Eliquis for 2 days prior to procedure. Please resume Eliquis as soon as possible postprocedure, at the discretion of the surgeon.  Final clearance pending echo results.  ADDENDUM 02/01/2023: Per Dr. 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 Suspect this is chronic. Looks like diuretics were recently increased.   Dr. Rexene Edison"  Therefore, based on ACC/AHA guidelines, patient would be at acceptable risk for the planned procedure without further cardiovascular testing. I will route this recommendation to the requesting party via Epic fax function.  7. Disposition: Follow-up in 6 months.      Joylene Grapes,  NP 12/27/2022, 9:40 AM

## 2022-12-27 NOTE — Patient Instructions (Addendum)
Medication Instructions:  Your physician recommends that you continue on your current medications as directed. Please refer to the Current Medication list given to you today.   *If you need a refill on your cardiac medications before your next appointment, please call your pharmacy*   Lab Work: NONE ordered at this time of appointment   If you have labs (blood work) drawn today and your tests are completely normal, you will receive your results only by: MyChart Message (if you have MyChart) OR A paper copy in the mail If you have any lab test that is abnormal or we need to change your treatment, we will call you to review the results.   Testing/Procedures: Your physician has requested that you have an echocardiogram. Echocardiography is a painless test that uses sound waves to create images of your heart. It provides your doctor with information about the size and shape of your heart and how well your heart's chambers and valves are working. This procedure takes approximately one hour. There are no restrictions for this procedure. Please do NOT wear cologne, perfume, aftershave, or lotions (deodorant is allowed). Please arrive 15 minutes prior to your appointment time.    Follow-Up: At [facility], you and your health needs are our priority.  As part of our continuing mission to provide you with exceptional heart care, we have created designated Provider Care Teams.  These Care Teams include your primary Cardiologist (physician) and Advanced Practice Providers (APPs -  Physician Assistants and Nurse Practitioners) who all work together to provide you with the care you need, when you need it.  We recommend signing up for the patient portal called "MyChart".  Sign up information is provided on this After Visit Summary.  MyChart is used to connect with patients for Virtual Visits (Telemedicine).  Patients are able to view lab/test results, encounter notes, upcoming appointments, etc.   Non-urgent messages can be sent to your provider as well.   To learn more about what you can do with MyChart, go to [email or web address].    Your next appointment:   6 month(s)  Provider:   Chrystie Nose, MD     Other Instructions

## 2023-01-03 ENCOUNTER — Telehealth: Payer: Self-pay | Admitting: Pulmonary Disease

## 2023-01-03 NOTE — Telephone Encounter (Signed)
Robynn Pane,   Please put this patient on a list to follow up on his cardiac clearance. Once we get that we can schedule him for a bronch   Thanks   Brad    ------------------------------ Awaiting echo results. Pt scheduled for echo on 01/29/24. Will follow back up.

## 2023-01-11 ENCOUNTER — Ambulatory Visit: Payer: Medicare Other | Admitting: Adult Health

## 2023-01-29 ENCOUNTER — Ambulatory Visit (INDEPENDENT_AMBULATORY_CARE_PROVIDER_SITE_OTHER): Payer: Medicare Other

## 2023-01-29 DIAGNOSIS — I5032 Chronic diastolic (congestive) heart failure: Secondary | ICD-10-CM

## 2023-01-29 LAB — ECHOCARDIOGRAM COMPLETE
AR max vel: 2.58 cm2
AV Area VTI: 2.53 cm2
AV Area mean vel: 2.03 cm2
AV Mean grad: 2 mmHg
AV Peak grad: 2.7 mmHg
Ao pk vel: 0.82 m/s
Area-P 1/2: 3.01 cm2
P 1/2 time: 1045 msec
S' Lateral: 4.31 cm

## 2023-01-29 MED ORDER — PERFLUTREN LIPID MICROSPHERE
1.0000 mL | INTRAVENOUS | Status: AC | PRN
  Administered 2023-01-29: 3 mL via INTRAVENOUS

## 2023-01-31 ENCOUNTER — Encounter: Payer: Self-pay | Admitting: Pulmonary Disease

## 2023-01-31 DIAGNOSIS — R911 Solitary pulmonary nodule: Secondary | ICD-10-CM | POA: Insufficient documentation

## 2023-01-31 NOTE — Telephone Encounter (Signed)
Patient has completed echo on 01/29/2023. Will forward to Dr. Tonia Brooms. Result note from Dr. [name] is below.   Per Dr. 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 Suspect this is chronic. Looks like diuretics were recently increased.   Dr Rexene Edison

## 2023-01-31 NOTE — Telephone Encounter (Signed)
FYI- Dr Cleone Slim Morning Dr Tonia Brooms! Per the note from Dr [name], my dad - Cory Morris - completed his echocardiogram and he got the thumbs up to move forward with the biopsy. I am traveling through the weekend but will call on Monday to get him on your calendar. My dad's quality of life has not been impacted at all thus far and he is doing well.  Best Regards, [name] [phone]

## 2023-02-13 ENCOUNTER — Other Ambulatory Visit: Payer: Self-pay

## 2023-02-13 ENCOUNTER — Emergency Department (HOSPITAL_BASED_OUTPATIENT_CLINIC_OR_DEPARTMENT_OTHER)
Admission: EM | Admit: 2023-02-13 | Discharge: 2023-02-14 | Disposition: A | Discharge status: Home / Self Care | Payer: Medicare Other | Attending: Emergency Medicine | Admitting: Emergency Medicine

## 2023-02-13 DIAGNOSIS — Z7901 Long term (current) use of anticoagulants: Secondary | ICD-10-CM | POA: Insufficient documentation

## 2023-02-13 DIAGNOSIS — Z7951 Long term (current) use of inhaled steroids: Secondary | ICD-10-CM | POA: Diagnosis not present

## 2023-02-13 DIAGNOSIS — R410 Disorientation, unspecified: Secondary | ICD-10-CM | POA: Insufficient documentation

## 2023-02-13 DIAGNOSIS — R509 Fever, unspecified: Secondary | ICD-10-CM | POA: Insufficient documentation

## 2023-02-13 DIAGNOSIS — R350 Frequency of micturition: Secondary | ICD-10-CM | POA: Diagnosis not present

## 2023-02-13 DIAGNOSIS — J449 Chronic obstructive pulmonary disease, unspecified: Secondary | ICD-10-CM | POA: Diagnosis not present

## 2023-02-13 LAB — CBC WITH DIFFERENTIAL/PLATELET
Abs Immature Granulocytes: 0.04 10*3/uL (ref 0.00–0.07)
Basophils Absolute: 0.1 10*3/uL (ref 0.0–0.1)
Basophils Relative: 1 %
Eosinophils Absolute: 0.1 10*3/uL (ref 0.0–0.5)
Eosinophils Relative: 0 %
HCT: 38.6 % — ABNORMAL LOW (ref 39.0–52.0)
Hemoglobin: 12.6 g/dL — ABNORMAL LOW (ref 13.0–17.0)
Immature Granulocytes: 0 %
Lymphocytes Relative: 8 %
Lymphs Abs: 1.2 10*3/uL (ref 0.7–4.0)
MCH: 29.8 pg (ref 26.0–34.0)
MCHC: 32.6 g/dL (ref 30.0–36.0)
MCV: 91.3 fL (ref 80.0–100.0)
Monocytes Absolute: 1.3 10*3/uL — ABNORMAL HIGH (ref 0.1–1.0)
Monocytes Relative: 8 %
Neutro Abs: 12.3 10*3/uL — ABNORMAL HIGH (ref 1.7–7.7)
Neutrophils Relative %: 83 %
Platelets: 174 10*3/uL (ref 150–400)
RBC: 4.23 MIL/uL (ref 4.22–5.81)
RDW: 16.7 % — ABNORMAL HIGH (ref 11.5–15.5)
WBC: 15 10*3/uL — ABNORMAL HIGH (ref 4.0–10.5)
nRBC: 0 % (ref 0.0–0.2)

## 2023-02-13 LAB — COMPREHENSIVE METABOLIC PANEL
ALT: 8 U/L (ref 0–44)
AST: 11 U/L — ABNORMAL LOW (ref 15–41)
Albumin: 4.2 g/dL (ref 3.5–5.0)
Alkaline Phosphatase: 91 U/L (ref 38–126)
Anion gap: 11 (ref 5–15)
BUN: 46 mg/dL — ABNORMAL HIGH (ref 8–23)
CO2: 27 mmol/L (ref 22–32)
Calcium: 9.6 mg/dL (ref 8.9–10.3)
Chloride: 98 mmol/L (ref 98–111)
Creatinine, Ser: 2.5 mg/dL — ABNORMAL HIGH (ref 0.61–1.24)
GFR, Estimated: 25 mL/min — ABNORMAL LOW (ref >60)
Glucose, Bld: 189 mg/dL — ABNORMAL HIGH (ref 70–99)
Potassium: 4.2 mmol/L (ref 3.5–5.1)
Sodium: 136 mmol/L (ref 135–145)
Total Bilirubin: 1.5 mg/dL — ABNORMAL HIGH (ref 0.3–1.2)
Total Protein: 7.4 g/dL (ref 6.5–8.1)

## 2023-02-13 LAB — LACTIC ACID, PLASMA: Lactic Acid, Venous: 1.6 mmol/L (ref 0.5–1.9)

## 2023-02-13 NOTE — ED Provider Notes (Signed)
[place] EMERGENCY DEPARTMENT AT [facility] Provider Note   CSN: [id number] Arrival date & time: 02/13/23  2034     History  Chief Complaint  Patient presents with   Urinary Frequency    Cory Morris is a 82 y.o. male.  Patient is an 82 year old male with past medical history of COPD, permanent A-fib, hyperlipidemia.  Patient presenting today with recommendations of urgent care for further evaluation.  Patient this afternoon seemed slightly confused and was having some urinary frequency.  Daughter took him to urgent care where he was found to have a temperature of 100.6.  He was given Tylenol, had a negative urinalysis, and was referred here for further workup.  Patient denies to me he is having any chest pain, difficulty breathing, cough, abdominal pain, or other symptoms.  The history is provided by the patient.       Home Medications Prior to Admission medications   Medication Sig Start Date End Date Taking? Authorizing Provider  albuterol (VENTOLIN HFA) 108 (90 Base) MCG/ACT inhaler INHALE TWO PUFFS BY MOUTH INTO LUNGS EVERY 6 HOURS AS NEEDED FOR WHEEZING/SHORTNESS OF BREATH 09/20/22   Mannam, Praveen, MD  allopurinol (ZYLOPRIM) 100 MG tablet Take 100 mg by mouth.    [provider]  Ascorbic Acid (VITAMIN C WITH ROSE HIPS) 1000 MG tablet Take 1,000 mg by mouth daily.    [provider]  atorvastatin (LIPITOR) 80 MG tablet Take 80 mg by mouth daily.    [provider]  azithromycin (ZITHROMAX) 250 MG tablet Take as directed 10/23/22   Mannam, Colbert Coyer, MD  cetirizine (ZYRTEC) 10 MG tablet Take 10 mg by mouth daily as needed.    [provider]  cholecalciferol (VITAMIN D3) 25 MCG (1000 UNIT) tablet Take 2,000 Units by mouth daily.    [provider]  diltiazem (CARDIZEM CD) 120 MG 24 hr capsule Take 120 mg by mouth daily.    [provider]  ELIQUIS 2.5 MG TABS tablet Take 2.5 mg by mouth 2 (two) times daily.  09/24/21   [provider]  Fluticasone-Umeclidin-Vilant (TRELEGY ELLIPTA) 200-62.5-25 MCG/ACT AEPB INHALE 1 PUFF BY MOUTH INTO LUNGS DAILY 11/08/22   Mannam, Praveen, MD  ipratropium-albuterol (DUONEB) 0.5-2.5 (3) MG/3ML SOLN Take 3 mLs by nebulization every 6 (six) hours as needed. 12/21/22   Mannam, Colbert Coyer, MD  latanoprost (XALATAN) 0.005 % ophthalmic solution 1 drop at bedtime.    [provider]  metoprolol tartrate (LOPRESSOR) 100 MG tablet Take 100 mg by mouth 2 (two) times daily.    [provider]  montelukast (SINGULAIR) 10 MG tablet Take 10 mg by mouth daily. 08/19/22   [provider]  torsemide (DEMADEX) 10 MG tablet Take 10 mg by mouth. Taking M,W,F 20 MG    [provider]      Allergies    Ace inhibitors and Dronedarone    Review of Systems   Review of Systems  All other systems reviewed and are negative.   Physical Exam Updated Vital Signs BP 113/73   Pulse 81   Temp 98.7 F (37.1 C) (Oral)   Resp 13   Ht 5\' 7"  (1.702 m)   Wt 80.3 kg   SpO2 96%   BMI 27.72 kg/m  Physical Exam Vitals and nursing note reviewed.  Constitutional:      General: He is not in acute distress.    Appearance: He is well-developed. He is not diaphoretic.  HENT:     Head: Normocephalic and  atraumatic.  Cardiovascular:     Rate and Rhythm: Normal rate and regular rhythm.     Heart sounds: No murmur heard.    No friction rub.  Pulmonary:     Effort: Pulmonary effort is normal. No respiratory distress.     Breath sounds: Normal breath sounds. No wheezing or rales.  Abdominal:     General: Bowel sounds are normal. There is no distension.     Palpations: Abdomen is soft.     Tenderness: There is no abdominal tenderness.  Musculoskeletal:        General: Normal range of motion.     Cervical back: Normal range of motion and neck supple.  Skin:    General: Skin is warm and dry.  Neurological:     Mental Status: He is alert and oriented to  person, place, and time.     Coordination: Coordination normal.    ED Results / Procedures / Treatments   Labs (all labs ordered are listed, but only abnormal results are displayed) Labs Reviewed  COMPREHENSIVE METABOLIC PANEL - Abnormal; Notable for the following components:      Result Value   Glucose, Bld 189 (*)    BUN 46 (*)    Creatinine, Ser 2.50 (*)    AST 11 (*)    Total Bilirubin 1.5 (*)    GFR, Estimated 25 (*)    All other components within normal limits  CBC WITH DIFFERENTIAL/PLATELET - Abnormal; Notable for the following components:   WBC 15.0 (*)    Hemoglobin 12.6 (*)    HCT 38.6 (*)    RDW 16.7 (*)    Neutro Abs 12.3 (*)    Monocytes Absolute 1.3 (*)    All other components within normal limits  LACTIC ACID, PLASMA  LACTIC ACID, PLASMA  URINALYSIS, ROUTINE W REFLEX MICROSCOPIC    EKG None  Radiology No results found.  Procedures Procedures    Medications Ordered in ED Medications - No data to display  ED Course/ Medical Decision Making/ A&P  Patient is an 82 year-old male with PMH as per HPI referred to the ED by UC for evaluation of urinary frequency and fever.  They were apparently concerned he was septic.  She was last night patient arrives here with stable vital signs and is afebrile.  His physical examination is basically unremarkable.  There is no abdominal tenderness, lungs are clear, and remainder of exam is normal.  Workup initiated including CBC, basic metabolic, and lactate.  White count is 15,000 and laboratory studies otherwise unremarkable.  Lactate is normal.  At this point, patient is clinically well-appearing with stable vital signs.  I feel as though patient can safely be discharged.  Because of the fever in urgent care unclear, but his urinalysis performed there shows no evidence for infection.    Disposition discussed with patient and daughter at bedside.  Patient states that he feels fine and wants to go home.  I feels that this  is a reasonable disposition.  Patient does not appear septic, has a normal lactate, and is clinically well-appearing.  His vitals are stable.  Patient to be discharged with as needed return.  Final Clinical Impression(s) / ED Diagnoses Final diagnoses:  None    Rx / DC Orders ED Discharge Orders     None         Geoffery Lyons, MD 02/14/23 0000

## 2023-02-13 NOTE — ED Triage Notes (Signed)
Patient here POV from Home with Family.  Endorses Frequency that began today from 1000 today. Has since become better. No Pain. No N/V/D. No New SOB (History of Asthma).   Sent by UC for Assessment. 100.6 Temp at UC and given 650 mg of Tylenol.   NAD Noted during triage. A&Ox4. GCS 15. BIB Personal Wheelchair.

## 2023-02-14 LAB — URINALYSIS, ROUTINE W REFLEX MICROSCOPIC
Bacteria, UA: NONE SEEN
Bilirubin Urine: NEGATIVE
Glucose, UA: NEGATIVE mg/dL
Ketones, ur: NEGATIVE mg/dL
Leukocytes,Ua: NEGATIVE
Nitrite: NEGATIVE
Protein, ur: 30 mg/dL — AB
Specific Gravity, Urine: 1.013 (ref 1.005–1.030)
pH: 5.5 (ref 5.0–8.0)

## 2023-02-14 NOTE — ED Notes (Signed)
RN reviewed discharge instructions with pt. Pt verbalized understanding and had no further questions. VSS upon discharge.  

## 2023-02-14 NOTE — Discharge Instructions (Signed)
Return to the emergency department if you develop cough with difficulty breathing, severe abdominal pain, or for other new and concerning symptoms.

## 2023-02-15 IMAGING — DX DG CHEST 2V
3 series · 3 of 3 positions shown · non-contrast
Comparison: 07/22/2020

CLINICAL DATA: Cough and dyspnea

EXAM:
CHEST - 2 VIEW

[chest pa]
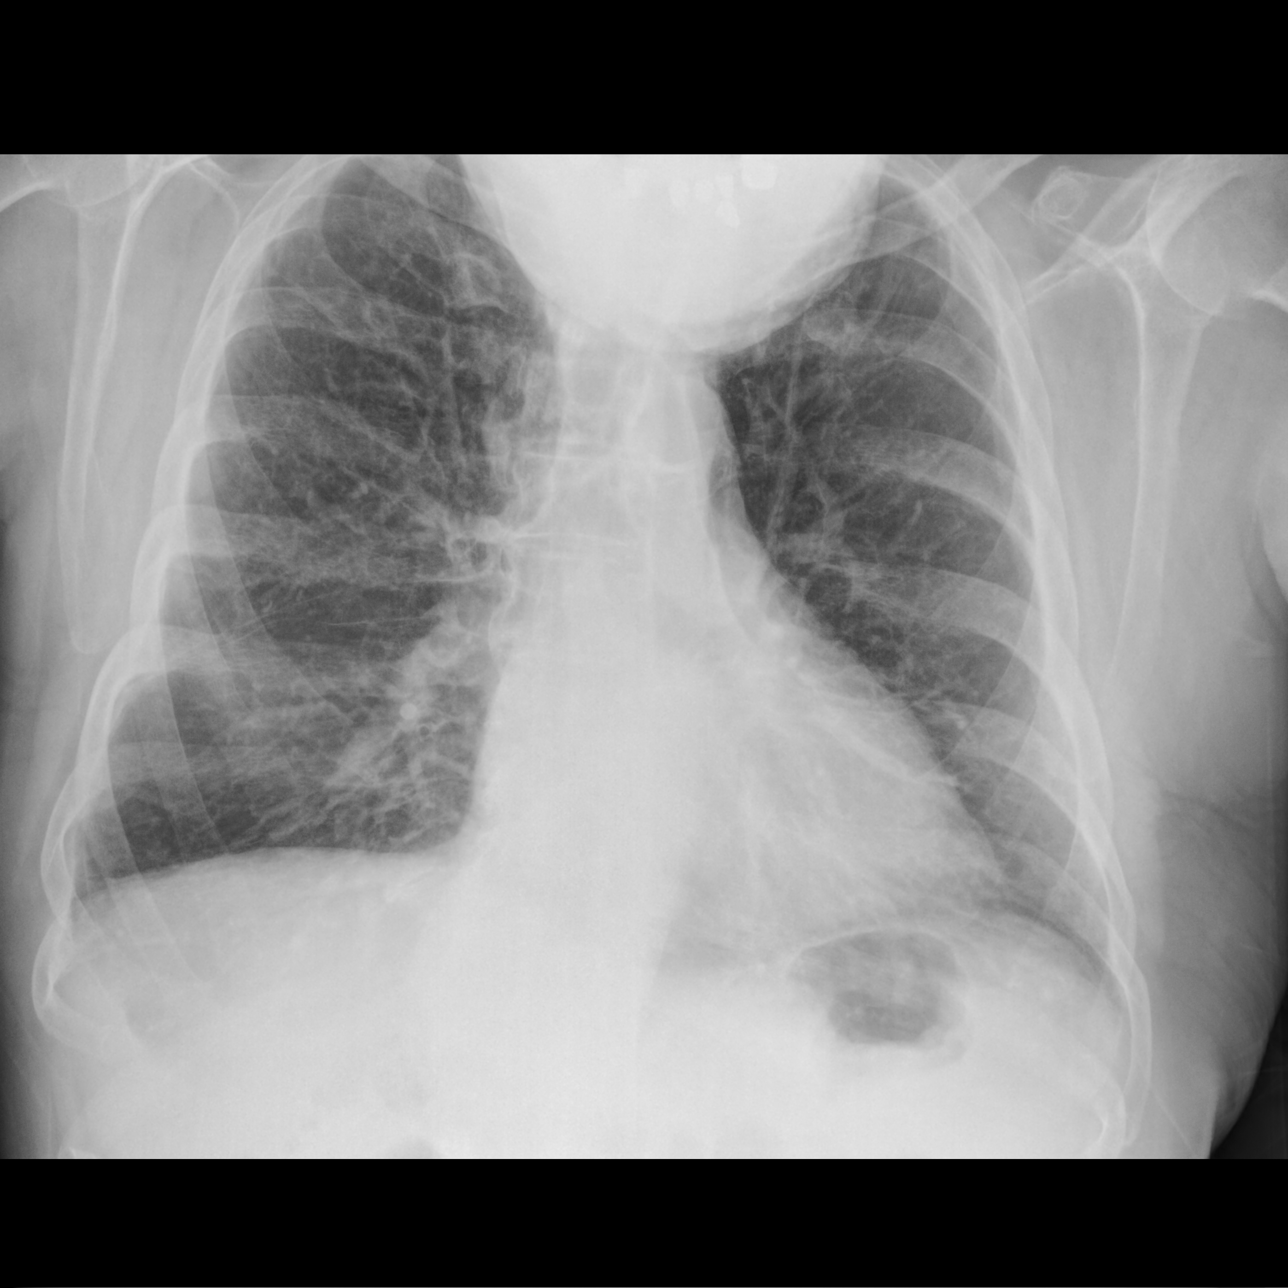

[chest ap]
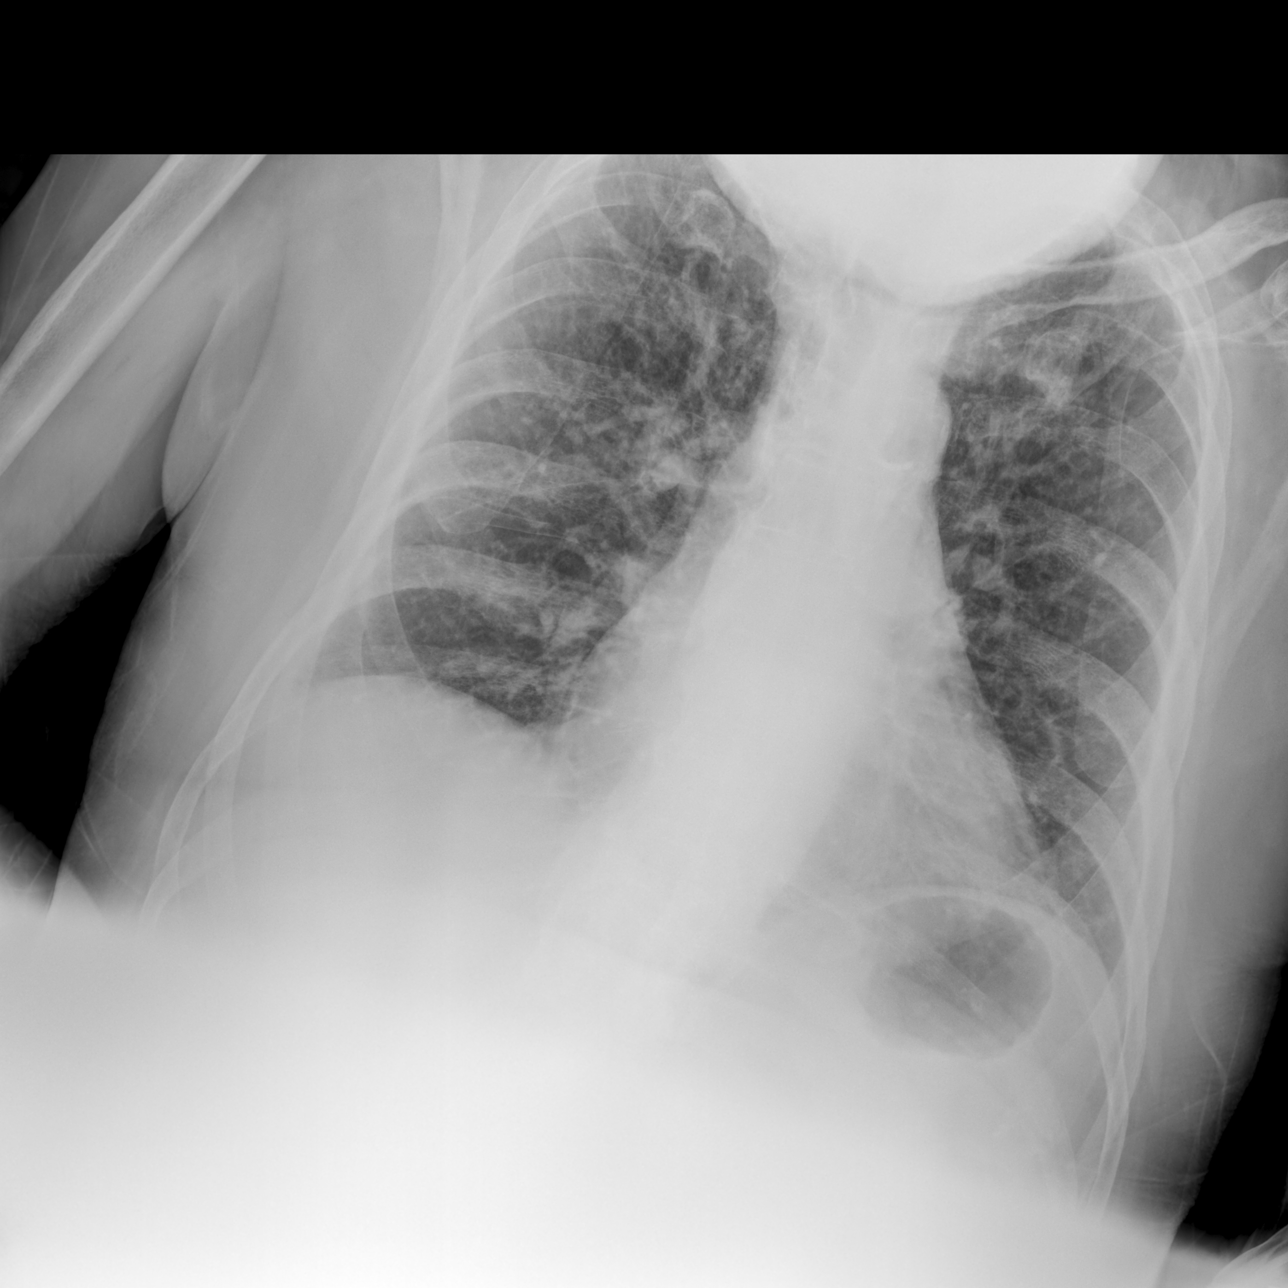

[chest lat]
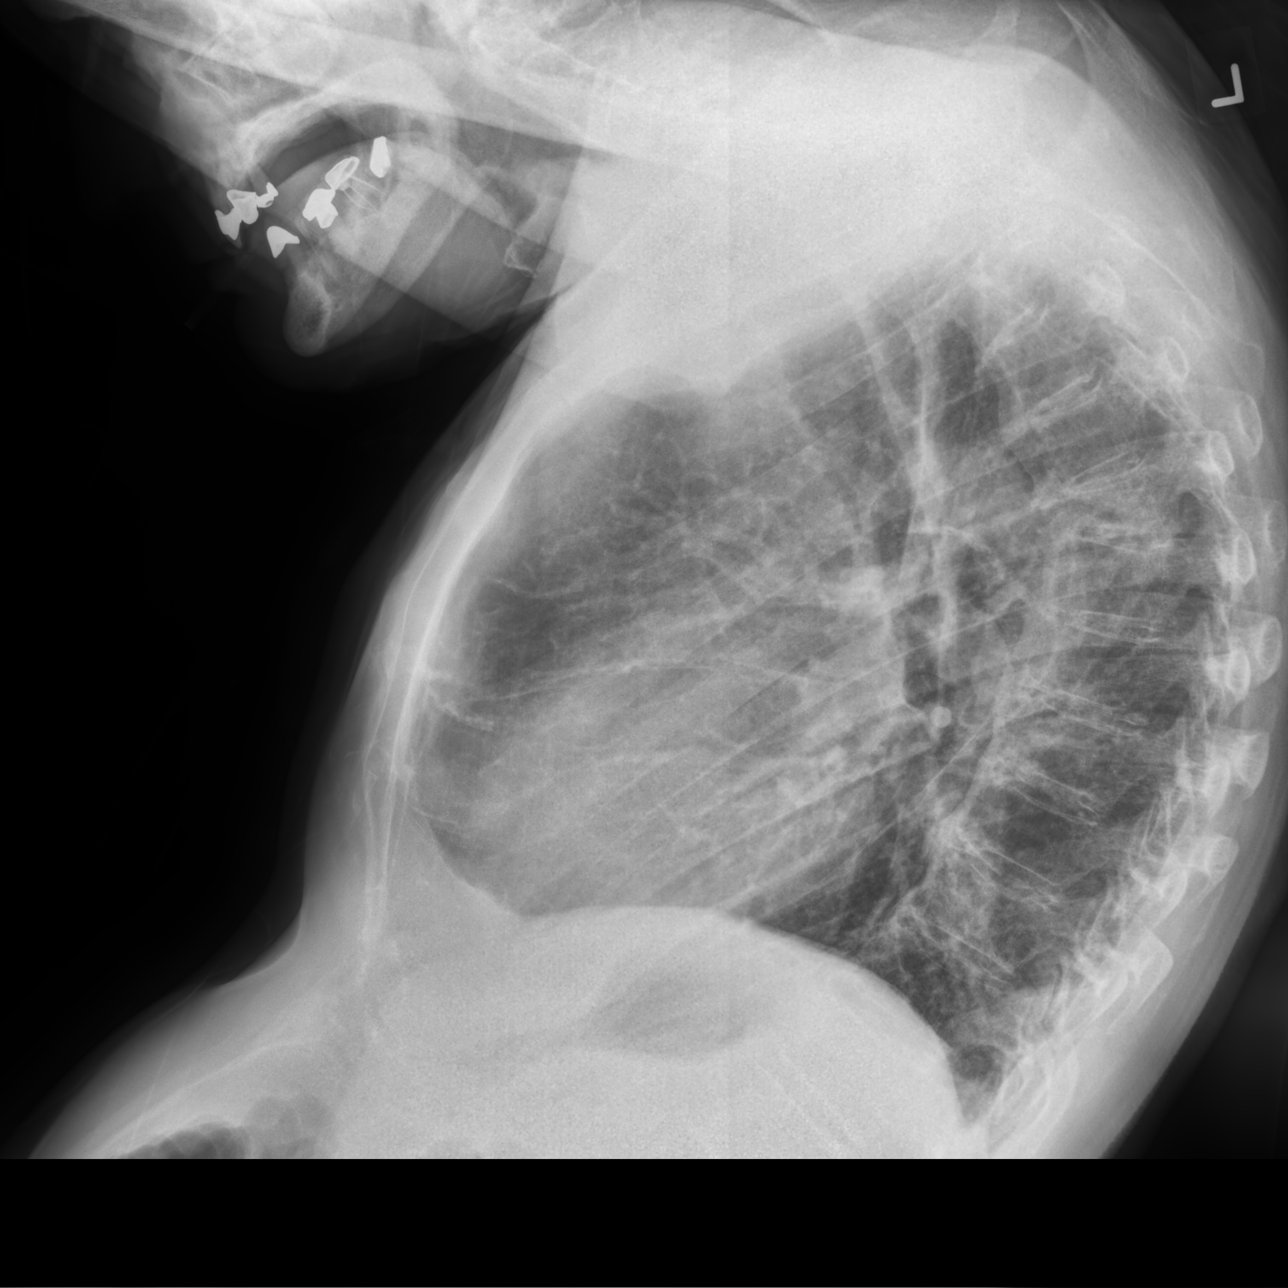

[3 of 3 positions shown; findings below may reference images not displayed]

FINDINGS: Check shadow is within normal limits. Lungs are well aerated
bilaterally. Chronic interstitial changes are again identified and
stable. No focal infiltrate or sizable effusion is seen.
Degenerative changes of the thoracic spine are noted. Upper thoracic
compression deformity has progressed somewhat in the interval from
the prior exam but chronic in nature.
IMPRESSION: Chronic changes without acute abnormality.

## 2023-02-18 NOTE — Telephone Encounter (Signed)
PCCM:  Orders placed for bronchoscopy on 7/15 If this date does not work I can look for other ooptions   Thanks  BLI

## 2023-02-18 NOTE — Telephone Encounter (Signed)
Scheduling has told me no more availability on 7/15.  Dr Tonia Brooms has [employer].

## 2023-02-19 ENCOUNTER — Encounter: Payer: Self-pay | Admitting: Pulmonary Disease

## 2023-02-19 NOTE — Telephone Encounter (Signed)
Nothing further needed 

## 2023-02-19 NOTE — Telephone Encounter (Signed)
Pt has been scheduled for 7/15.  I spoke to dtr Larita Fife and gave her all appt info and sent bronch letter thru Mychart.  Will route message back to triage so message can be closed.

## 2023-02-21 ENCOUNTER — Encounter (HOSPITAL_COMMUNITY): Payer: Self-pay | Admitting: Pulmonary Disease

## 2023-02-21 ENCOUNTER — Other Ambulatory Visit: Payer: Self-pay

## 2023-02-21 NOTE — Progress Notes (Signed)
Spoke with pt's daughter, Arman Bogus for pre-op call. DPR on file. She states that pt is treated for Atrial fib. Pt is on Eliquis, she states they have not been instructed to stop Eliquis and was hoping I would be able to answer that question. I told her that I'm trying to contact Dr. Tonia Brooms and I will call her back when I get instructions from him. She voiced understanding.   Orlie Pollen states that her father is not diabetic and not treated for HTN.   Shower instructions given to Ensign and she voiced understanding.

## 2023-02-21 NOTE — Progress Notes (Signed)
Dr. Tonia Brooms messaged me stating that Mr. Cory Morris needs to hold his Eliquis "2 full days" prior to Bronchoscopy. I called pt's daughter [name] and gave her those instructions. Last dose of Eliquis will be tomorrow, Friday, 02/22/23. Orlie Pollen voiced understanding.

## 2023-02-22 NOTE — Progress Notes (Signed)
Anesthesia Chart Review: Same day workup  Follows with cardiology for history of permanent atrial fibrillation, moderate right ventricular dysfunction, diastolic heart failure, aortic atherosclerosis.  Seen by [name], NP on 12/27/2022 for preop evaluation.  Per note, "Preoperative cardiac evaluation: According to the Revised Cardiac Risk Index (RCRI), his Perioperative Risk of Major Cardiac Event is (%): 0.9. His Functional Capacity in METs is: 3.97 according to the Duke Activity Status Index (DASI).  He is unable to complete 4 METS at baseline.  Discussed with Dr. [name], primary cardiologist.  Patient is not a candidate for ischemic evaluation (CT, stress test, cath) given CKD stage IV, COPD.  Per Dr. [name], if echo stable, he should be at reasonable risk to proceed with bronchoscopy. Per office protocol, patient can hold Eliquis for 2 days prior to procedure. Please resume Eliquis as soon as possible postprocedure, at the discretion of the surgeon.  Final clearance pending echo results. ADDENDUM 02/01/2023: Per Dr. 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 Suspect this is chronic. Looks like diuretics were recently increased."  Last dose Eliquis 02/22/2023.  Follows with pulmonology for history of COPD.  60-pack-year smoking history, quit 2021.  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  History of CKD 4.  CMP and CBC from 02/13/2023 reviewed, creatinine 2.50 consistent with history of CKD, mild leukocytosis with WBC 15.0, mild anemia with hemoglobin 12.6.  Patient will need day of surgery evaluation.  EKG 12/27/2022: Fibrillation.  Rate 96.  Right bundle branch block.  CT super D chest 10/17/2022: IMPRESSION: 1. New 1.1 by 0.7 cm right lower lobe pulmonary nodule and new 0.8 by 0.7 cm left lower lobe pulmonary nodule. These are most likely to be inflammatory given the  less than 3 month interval compared to the prior chest CT. 2. Stable 1.8 by 1.2 cm left lower lobe pulmonary nodule medially. Cannot exclude malignancy. This may warrant PET-CT or biopsy. 3. Prior nodular densities posteriorly in the left upper lobe and along the left hemidiaphragm have resolved. 4. Small bilateral pleural effusions with passive atelectasis. 5. Airway thickening is present, suggesting bronchitis or reactive airways disease. 6. Stable large lucent portion of the T5 vertebral body, possibly from prior infection but technically nonspecific. 7. Stable nonspecific subpleural ground-glass density in the right lower lobe. 8. Aortic atherosclerosis.  TTE 01/29/2023:  1. Poor acoustic windows limit study. RV free wall difficult to see  apical window is foreshortened.   2. Left ventricular ejection fraction, by estimation, is 50 to 55%. The  left ventricle has low normal function.   3. RV free wall difficult to see well . Right ventricular systolic  function is moderately reduced. The right ventricular size is moderately  enlarged.   4. Left atrial size was moderately dilated.   5. Right atrial size was moderately dilated.   6. Mild mitral valve regurgitation.   7. Tricuspid valve regurgitation is severe.   8. The aortic valve is tricuspid. Aortic valve regurgitation is mild.  Aortic valve sclerosis/calcification is present, without any evidence of  aortic stenosis.   9. The inferior vena cava is dilated in size with <50% respiratory  variability, suggesting right atrial pressure of 15 mmHg.    Cory Morris [facility] Short Stay Center/Anesthesiology Phone [phone] 02/22/2023 12:21 PM

## 2023-02-22 NOTE — Anesthesia Preprocedure Evaluation (Addendum)
Anesthesia Evaluation    Airway        Dental   Pulmonary COPD,  COPD inhaler, former smoker Pulmonary nodules          Cardiovascular hypertension, Pt. on medications and Pt. on home beta blockers pulmonary hypertension+ dysrhythmias Atrial Fibrillation   01/2023 ECHO: EF 50 to 55%.  1. The LV has low normal function.   3. RV free wall difficult to see well . Right ventricular systolic function is moderately reduced. The right ventricular size is moderately enlarged.   4. Left atrial size was moderately dilated.   5. Right atrial size was moderately dilated.   6. Mild mitral valve regurgitation.   7. Tricuspid valve regurgitation is severe.   8. The aortic valve is tricuspid. Aortic valve regurgitation is mild. Aortic valve sclerosis/calcification is present, without any evidence of aortic stenosis.     Neuro/Psych    GI/Hepatic   Endo/Other    Renal/GU Renal InsufficiencyRenal disease     Musculoskeletal   Abdominal   Peds  Hematology eliquis   Anesthesia Other Findings   Reproductive/Obstetrics                             Anesthesia Physical Anesthesia Plan  ASA: 3  Anesthesia Plan: General   Post-op Pain Management: Tylenol PO (pre-op)*   Induction: Intravenous  PONV Risk Score and Plan: 2 and Ondansetron and Dexamethasone  Airway Management Planned: Oral ETT  Additional Equipment: None  Intra-op Plan:   Post-operative Plan: Extubation in OR  Informed Consent:   Plan Discussed with:   Anesthesia Plan Comments: (PAT note by Antionette Poles, PA-C:  Follows with cardiology for history of permanent atrial fibrillation, moderate right ventricular dysfunction, diastolic heart failure, aortic atherosclerosis.  Seen by [name], NP on 12/27/2022 for preop evaluation.  Per note, "Preoperative cardiac evaluation: According to the Revised Cardiac Risk Index (RCRI), his Perioperative  Risk of Major Cardiac Event is (%): 0.9. His Functional Capacity in METs is: 3.97 according to the Duke Activity Status Index (DASI).  He is unable to complete 4 METS at baseline.  Discussed with Dr. [name], primary cardiologist.  Patient is not a candidate for ischemic evaluation (CT, stress test, cath) given CKD stage IV, COPD.  Per Dr. [name], if echo stable, he should be at reasonable risk to proceed with bronchoscopy. Per office protocol, patient can hold Eliquis for 2 days prior to procedure. Please resume Eliquis as soon as possible postprocedure, at the discretion of the surgeon.  Final clearance pending echo results. ADDENDUM 02/01/2023: Per Dr. 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 Suspect this is chronic. Looks like diuretics were recently increased."  Last dose Eliquis 02/22/2023.  Follows with pulmonology for history of COPD.  60-pack-year smoking history, quit 2021.  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  History of CKD 4.  CMP and CBC from 02/13/2023 reviewed, creatinine 2.50 consistent with history of CKD, mild leukocytosis with WBC 15.0, mild anemia with hemoglobin 12.6.  Patient will need day of surgery evaluation.  EKG 12/27/2022: Fibrillation.  Rate 96.  Right bundle branch block.  CT super D chest 10/17/2022: IMPRESSION: 1. New 1.1 by 0.7 cm right lower lobe pulmonary nodule and new 0.8 by 0.7 cm left lower lobe pulmonary nodule. These are most likely to be inflammatory given  the less than 3 month interval compared to the prior chest CT. 2. Stable 1.8 by 1.2 cm left lower lobe pulmonary nodule medially. Cannot exclude malignancy. This may warrant PET-CT or biopsy. 3. Prior nodular densities posteriorly in the left upper lobe and along the left hemidiaphragm have resolved. 4. Small bilateral pleural effusions with passive atelectasis. 5. Airway  thickening is present, suggesting bronchitis or reactive airways disease. 6. Stable large lucent portion of the T5 vertebral body, possibly from prior infection but technically nonspecific. 7. Stable nonspecific subpleural ground-glass density in the right lower lobe. 8. Aortic atherosclerosis.  TTE 01/29/2023: 1. Poor acoustic windows limit study. RV free wall difficult to see  apical window is foreshortened.  2. Left ventricular ejection fraction, by estimation, is 50 to 55%. The  left ventricle has low normal function.  3. RV free wall difficult to see well . Right ventricular systolic  function is moderately reduced. The right ventricular size is moderately  enlarged.  4. Left atrial size was moderately dilated.  5. Right atrial size was moderately dilated.  6. Mild mitral valve regurgitation.  7. Tricuspid valve regurgitation is severe.  8. The aortic valve is tricuspid. Aortic valve regurgitation is mild.  Aortic valve sclerosis/calcification is present, without any evidence of  aortic stenosis.  9. The inferior vena cava is dilated in size with <50% respiratory  variability, suggesting right atrial pressure of 15 mmHg.   )        Anesthesia Quick Evaluation

## 2023-02-25 ENCOUNTER — Other Ambulatory Visit: Payer: Self-pay

## 2023-02-25 ENCOUNTER — Ambulatory Visit (HOSPITAL_COMMUNITY): Payer: Medicare Other

## 2023-02-25 ENCOUNTER — Encounter (HOSPITAL_COMMUNITY): Payer: Self-pay | Admitting: Physician Assistant

## 2023-02-25 ENCOUNTER — Encounter (HOSPITAL_COMMUNITY): Payer: Self-pay | Admitting: Pulmonary Disease

## 2023-02-25 ENCOUNTER — Ambulatory Visit (HOSPITAL_COMMUNITY)
Admission: RE | Admit: 2023-02-25 | Discharge: 2023-02-25 | Disposition: A | Discharge status: Home / Self Care | Payer: Medicare Other | Source: Ambulatory Visit | Attending: Pulmonary Disease | Admitting: Pulmonary Disease

## 2023-02-25 ENCOUNTER — Encounter (HOSPITAL_COMMUNITY)
Admission: RE | Disposition: A | Discharge status: Home / Self Care | Payer: Self-pay | Source: Ambulatory Visit | Attending: Pulmonary Disease

## 2023-02-25 DIAGNOSIS — I700 Atherosclerosis of aorta: Secondary | ICD-10-CM | POA: Diagnosis not present

## 2023-02-25 DIAGNOSIS — R198 Other specified symptoms and signs involving the digestive system and abdomen: Secondary | ICD-10-CM | POA: Insufficient documentation

## 2023-02-25 DIAGNOSIS — I2510 Atherosclerotic heart disease of native coronary artery without angina pectoris: Secondary | ICD-10-CM | POA: Diagnosis not present

## 2023-02-25 DIAGNOSIS — Z538 Procedure and treatment not carried out for other reasons: Secondary | ICD-10-CM | POA: Insufficient documentation

## 2023-02-25 DIAGNOSIS — R911 Solitary pulmonary nodule: Secondary | ICD-10-CM | POA: Insufficient documentation

## 2023-02-25 HISTORY — DX: Cardiac arrhythmia, unspecified: I49.9

## 2023-02-25 HISTORY — DX: Essential (primary) hypertension: I10

## 2023-02-25 HISTORY — DX: Family history of other specified conditions: Z84.89

## 2023-02-25 SURGERY — CANCELLED PROCEDURE
Laterality: Left

## 2023-02-25 MED ORDER — METOPROLOL TARTRATE 100 MG PO TABS
100.0000 mg | ORAL_TABLET | Freq: Once | ORAL | Status: AC
  Administered 2023-02-25: 100 mg via ORAL
  Filled 2023-02-25: qty 2
  Filled 2023-02-25: qty 1

## 2023-02-25 MED ORDER — CHLORHEXIDINE GLUCONATE 0.12 % MT SOLN
15.0000 mL | Freq: Once | OROMUCOSAL | Status: AC
  Administered 2023-02-25: 15 mL via OROMUCOSAL
  Filled 2023-02-25: qty 15

## 2023-02-25 MED ORDER — LACTATED RINGERS IV SOLN: INTRAVENOUS | Status: DC

## 2023-02-25 MED ORDER — ACETAMINOPHEN 500 MG PO TABS: 1000.0000 mg | ORAL_TABLET | Freq: Once | ORAL | Status: DC

## 2023-02-25 MED ORDER — SODIUM CHLORIDE 0.9 % IV SOLN: INTRAVENOUS | Status: DC

## 2023-02-25 NOTE — Progress Notes (Signed)
Per Dr. Tonia Brooms today's surgery is canceled due to today's CT chest results. PIV removed- clean, dry, and intact. Patient's daughter is present in room. Patient assisted by his daughter to change clothes. Escorted to lobby via wheelchair.

## 2023-02-25 NOTE — H&P (Addendum)
PCCM:  Procedure cancelled due to worsening bilateral effusions I have arranged follow up with Cardiology and Nephrology for this week.  Josephine Igo, DO [facility] 02/25/2023 1:13 PM

## 2023-02-25 NOTE — Progress Notes (Signed)
PCCM:  I spoke with the patient regarding CT chest that was complete prior to the bronchoscopy. The effusions that the patient had are now larger. I suspect he has worsening heart failure and kidney disease. I will recommend that we move forward with SBRT without a tissue diagnosis if rad onc is willing.   Josephine Igo, DO [facility] 02/25/2023 12:12 PM

## 2023-02-27 NOTE — Progress Notes (Signed)
Thoracic Location of Tumor / Histology: Left lung lower lobe pulmonary nodule  02/25/2023 Dr. Elige Radon Icard CT Super D Chest without Contrast CLINICAL DATA: Follow-up lung nodule.   IMPRESSION: 1. Progressive ground-glass attenuation and consolidation is noted within the posterolateral right upper lobe. New area of consolidation is also noted within the anterolateral right upper lobe. Findings are favored to represent multifocal pneumonia. Recommend follow-up imaging in 1 month following a trial of antibiotic therapy to ensure resolution and to exclude underlying malignancy. 2. The nodule of concern within the medial aspect of the superior segment of the right lower lobe is again noted. This measures approximately 2.2 x 1.3 cm, previously 1.9 x 1.1 cm. Findings are concerning for primary bronchogenic carcinoma. 3. Stable 0.8 cm nodule within the central aspect of the superior segment of the left lower lobe. 4. Lytic bone lesion involving the T5 vertebral body containing a small amount of gas is again noted. Previously attributed to infection, this appears similar in appearance to study from 01/23/2022 with signs of mild compression deformity. 5. Coronary artery calcification. 6.  Aortic Atherosclerosis (ICD10-I70.0).  11/27/2022 Dr. Chilton Greathouse NM PET Image Initial (PI) Skull Base To Thigh CLINICAL DATA:  Initial treatment strategy for left lower lobe pulmonary nodule.  IMPRESSION: 1. Hypermetabolic 1.8 cm left lower lobe pulmonary nodule, favoring primary bronchogenic carcinoma. Stage I A. 2. The new nodules on the prior chest CT are less distinct today, favoring a resolving infectious/inflammatory etiology. Recommend attention on follow-up. 3. 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 4. Increase in small bilateral pleural effusions.   10/17/2022 Dr. Chilton Greathouse CT Super D  Chest without Contrast CLINICAL DATA: Lung nodule   IMPRESSION: 1. New 1.1 by 0.7 cm right lower lobe pulmonary nodule and new 0.8 by 0.7 cm left lower lobe pulmonary nodule. These are most likely to be inflammatory given the less than 3 month interval compared to the prior chest CT. 2. Stable 1.8 by 1.2 cm left lower lobe pulmonary nodule medially. Cannot exclude malignancy. This may warrant PET-CT or biopsy. 3. Prior nodular densities posteriorly in the left upper lobe and along the left hemidiaphragm have resolved. 4. Small bilateral pleural effusions with passive atelectasis. 5. Airway thickening is present, suggesting bronchitis or reactive airways disease. 6. Stable large lucent portion of the T5 vertebral body, possibly from prior infection but technically nonspecific. 7. Stable nonspecific subpleural ground-glass density in the right lower lobe. 8. Aortic atherosclerosis.   Aortic Atherosclerosis (ICD10-I70.0).   Tobacco/Marijuana/Snuff/ETOH use: Quit smoking 11/2019, no drug, snuff use, occasional alcohol use.  Past/Anticipated interventions by cardiothoracic surgery, if any:  Dr. Elige Radon Icard Procedure cancelled due to worsening bilateral effusions I have arranged follow up with Cardiology and Nephrology for this week.   Past/Anticipated interventions by medical oncology, if any: NA  Signs/Symptoms Weight changes, if any: No Respiratory complaints, if any: SOB occasional at night and with exertion. Productive cough no blood in mucus. Hemoptysis, if any: 0/10  SAFETY ISSUES: Prior radiation? No Pacemaker/ICD?  No Possible current pregnancy? Male Is the patient on methotrexate? No  Current Complaints / other details:

## 2023-03-01 ENCOUNTER — Ambulatory Visit: Payer: Medicare Other | Admitting: Acute Care

## 2023-03-04 ENCOUNTER — Ambulatory Visit
Admission: RE | Admit: 2023-03-04 | Discharge: 2023-03-04 | Disposition: A | Discharge status: Home / Self Care | Payer: Medicare Other | Source: Ambulatory Visit | Attending: Radiation Oncology | Admitting: Radiation Oncology

## 2023-03-04 ENCOUNTER — Other Ambulatory Visit: Payer: Self-pay

## 2023-03-04 VITALS — BP 104/75 | HR 66 | Temp 96.6°F | Resp 18 | Ht 67.0 in | Wt 177.4 lb

## 2023-03-04 DIAGNOSIS — C3432 Malignant neoplasm of lower lobe, left bronchus or lung: Secondary | ICD-10-CM

## 2023-03-04 NOTE — Progress Notes (Signed)
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    ICD-10-CM   1. 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 male seen at the request of Dr. Tonia Brooms. He has been followed by Dr. 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 [facility] was [facility]. He developed some worsening SOB in 01/2022, and chest CT showed subtle and indeterminate findings for pneumonia. He returned for repeat CT in 07/2022 and was found to have interval enlargement (1.9 cm, previously measuring 1.5 cm)  of a subpleural opacity of LLL but resolution of opacity in RUL, overall indeterminate findings. Short-term follow up with a super D chest CT was performed on 10/17/22 and showed: new 1.1 cm RLL and 0.8 cm LLL pulmonary nodules, favored inflammatory; stable 1.8 cm LLL pulmonary nodule medially, malignancy not excluded. This prompted further evaluation with a PET scan on 11/27/22 showing: hypermetabolism to 1.8 cm LLL pulmonary nodule; new nodules from prior CT are less distinct, favoring resolving inflammatory/infectious etiology.  He was referred to Dr. Tonia Brooms and was recommended to proceed with bronchoscopy. He underwent repeat super D chest CT on 02/25/23 prior to the procedure showing: progressive ground-glass attenuation and new consolidation within RUL, favored to represent multifocal pneumonia. Given these findings, the bronchoscopy was cancelled, and Dr. Tonia Brooms recommended proceeding with SBRT without tissue diagnosis.   PREVIOUS RADIATION  THERAPY: No  PAST MEDICAL HISTORY:  Past Medical History:  Diagnosis Date   Aortic atherosclerosis (HCC)    CKD (chronic kidney disease) stage 4, GFR 15-29 ml/min (HCC)    COPD (chronic obstructive pulmonary disease) (HCC)    Dysrhythmia    Family history of adverse reaction to anesthesia    his 2 daughters have N/V   Gout    Hypertension    Permanent atrial fibrillation (HCC)    Prediabetes       PAST SURGICAL HISTORY: Past Surgical History:  Procedure Laterality Date   ATRIAL FIBRILLATION ABLATION      FAMILY HISTORY:  Family History  Problem Relation Age of Onset   Atrial fibrillation Neg Hx    Arrhythmia Neg Hx     SOCIAL HISTORY:  Social History   Socioeconomic History   Marital status: Married    Spouse name: Not on file   Number of children: Not on file   Years of education: Not on file   Highest education level: Not on file  Occupational History   Not on file  Tobacco Use   Smoking status: Former    Current packs/day: 1.00    Average packs/day: 1 pack/day for 60.0 years (60.0 ttl pk-yrs)    Types: Cigarettes   Smokeless tobacco: Never   Tobacco comments:    quit in April 2021  Vaping Use   Vaping status: Never Used  Substance and Sexual Activity   Alcohol use: Yes    Comment: occasional   Drug use: Never   Sexual activity: Not on file  Other Topics Concern   Not on file  Social History  Narrative   Not on file   Social Determinants of Health   Financial Resource Strain: Not on file  Food Insecurity: No Food Insecurity (03/04/2023)   Hunger Vital Sign    Worried About Running Out of Food in the Last Year: Never true    Ran Out of Food in the Last Year: Never true  Transportation Needs: No Transportation Needs (03/04/2023)   PRAPARE - Administrator, Civil Service (Medical): No    Lack of Transportation (Non-Medical): No  Physical Activity: Not on file  Stress: Not on file  Social Connections: Unknown (02/13/2023)   Received from  [facility]   Social Network    Social Network: Not on file  Intimate Partner Violence: Not At Risk (03/04/2023)   Humiliation, Afraid, Rape, and Kick questionnaire    Fear of Current or Ex-Partner: No    Emotionally Abused: No    Physically Abused: No    Sexually Abused: No    ALLERGIES: Ace inhibitors and Dronedarone  MEDICATIONS:  Current Outpatient Medications  Medication Sig Dispense Refill   albuterol (VENTOLIN HFA) 108 (90 Base) MCG/ACT inhaler INHALE TWO PUFFS BY MOUTH INTO LUNGS EVERY 6 HOURS AS NEEDED FOR WHEEZING/SHORTNESS OF BREATH 18 g 5   allopurinol (ZYLOPRIM) 100 MG tablet Take 100 mg by mouth.     atorvastatin (LIPITOR) 80 MG tablet Take 80 mg by mouth daily.     Biotin 1000 MCG CHEW Chew 1,000 mcg by mouth daily.     cholecalciferol (VITAMIN D3) 25 MCG (1000 UNIT) tablet Take 1,000 Units by mouth daily.     diltiazem (CARDIZEM CD) 120 MG 24 hr capsule Take 120 mg by mouth daily.     ELIQUIS 2.5 MG TABS tablet Take 2.5 mg by mouth 2 (two) times daily.     Fluticasone-Umeclidin-Vilant (TRELEGY ELLIPTA) 200-62.5-25 MCG/ACT AEPB INHALE 1 PUFF BY MOUTH INTO LUNGS DAILY 60 each 5   ipratropium-albuterol (DUONEB) 0.5-2.5 (3) MG/3ML SOLN Take 3 mLs by nebulization every 6 (six) hours as needed. 360 mL 5   latanoprost (XALATAN) 0.005 % ophthalmic solution Place 1 drop into both eyes at bedtime.     metoprolol tartrate (LOPRESSOR) 100 MG tablet Take 100 mg by mouth 2 (two) times daily.     montelukast (SINGULAIR) 10 MG tablet Take 10 mg by mouth daily.     Probiotic Product (PROBIOTIC DAILY PO) Take 220 mg by mouth daily. 60 billions probiotic 10 strains     tiZANidine (ZANAFLEX) 2 MG tablet Take 2 mg by mouth every 6 (six) hours as needed for muscle spasms.     torsemide (DEMADEX) 20 MG tablet Take 10 mg by mouth every Monday, Wednesday, and Friday.     No current facility-administered medications for this encounter.    REVIEW OF SYSTEMS:  On review of systems, the  patient reports that he is doing well overall. He endorses shortness of breath at night with exertion and a productive cough. He denies any chest pain, hemoptysis, fevers, chills, night sweats, unintended weight changes. He denies any bowel or bladder disturbances, and denies abdominal pain, nausea or vomiting. He has been experiencing intermittent back pain in his mid-spine for the past year, controlled with Tylenol.    PHYSICAL EXAM:  Wt Readings from Last 3 Encounters:  03/04/23 177 lb 6 oz (80.5 kg)  02/25/23 177 lb (80.3 kg)  02/13/23 177 lb (80.3 kg)   Temp Readings from Last 3 Encounters:  03/04/23 (!) 96.6 F (35.9 C) (  Temporal)  02/25/23 98.2 F (36.8 C)  02/13/23 98.7 F (37.1 C) (Oral)   BP Readings from Last 3 Encounters:  03/04/23 104/75  02/25/23 124/77  02/13/23 111/79   Pulse Readings from Last 3 Encounters:  03/04/23 66  02/25/23 96  02/13/23 93   Pain Assessment Pain Score: 0-No pain/10  In general this is a well appearing man in no acute distress. He's alert and oriented x4 and appropriate throughout the examination. Cardiopulmonary assessment is negative for acute distress and he exhibits normal effort.     KPS = 70  100 - Normal; no complaints; no evidence of disease. 90   - Able to carry on normal activity; minor signs or symptoms of disease. 80   - Normal activity with effort; some signs or symptoms of disease. 31   - Cares for self; unable to carry on normal activity or to do active work. 60   - Requires occasional assistance, but is able to care for most of his personal needs. 50   - Requires considerable assistance and frequent medical care. 40   - Disabled; requires special care and assistance. 30   - Severely disabled; hospital admission is indicated although death not imminent. 20   - Very sick; hospital admission necessary; active supportive treatment necessary. 10   - Moribund; fatal processes progressing rapidly. 0     - Dead  Karnofsky  DA, Abelmann WH, Craver LS and Burchenal [facility] [phone]) The use of the nitrogen mustards in the palliative treatment of carcinoma: with particular reference to bronchogenic carcinoma Cancer 1 634-56  LABORATORY DATA:  Lab Results  Component Value Date   WBC 15.0 (H) 02/13/2023   HGB 12.6 (L) 02/13/2023   HCT 38.6 (L) 02/13/2023   MCV 91.3 02/13/2023   PLT 174 02/13/2023   Lab Results  Component Value Date   NA 136 02/13/2023   K 4.2 02/13/2023   CL 98 02/13/2023   CO2 27 02/13/2023   Lab Results  Component Value Date   ALT 8 02/13/2023   AST 11 (L) 02/13/2023   ALKPHOS 91 02/13/2023   BILITOT 1.5 (H) 02/13/2023     RADIOGRAPHY: CT SUPER D CHEST WO CONTRAST  Result Date: 02/25/2023 CLINICAL DATA:  Follow-up lung nodule. EXAM: CT CHEST WITHOUT CONTRAST TECHNIQUE: Multidetector CT imaging of the chest was performed using thin slice collimation for electromagnetic bronchoscopy planning purposes, without intravenous contrast. RADIATION DOSE REDUCTION: This exam was performed according to the departmental dose-optimization program which includes automated exposure control, adjustment of the mA and/or kV according to patient size and/or use of iterative reconstruction technique. COMPARISON:  11/27/2022 FINDINGS: Cardiovascular: The heart size is upper limits of normal. Aortic atherosclerosis. Coronary artery calcification. No pericardial effusion. Mediastinum/Nodes: Thyroid gland, trachea, and esophagus are normal. Right paratracheal lymph node measures 1.4 cm, image 64/3. Previously 1.2 cm. Subcarinal lymph node measures 1.1 cm, image 77/3. Formally 0.9 cm. No enlarged axillary lymph nodes. The hilar lymph nodes are suboptimally evaluated due to lack of IV contrast. Lungs/Pleura: Progressive ground-glass attenuation and consolidation is noted within the posterolateral right upper lobe. New area of consolidation is also noted within the anterolateral right upper lobe, image 70/4. The nodule of  concern within the medial aspect of the superior segment of right lower lobe is again noted. This measures approximately 2.2 x 1.3 cm, image 78/4. Formally 1.9 x 1.1 cm. Nodule within the central aspect of the superior segment of the left lower lobe measures 0.8 cm, image 87/4.  Previously this measured the same., Upper Abdomen: No acute abnormality. Aortic atherosclerotic calcifications. Musculoskeletal: Lytic bone lesion involving the T5 vertebral body containing a small amount of gas is again noted. This appears similar in appearance to study from 01/23/2022 with signs of mild compression deformity. No additional focal bone lesions identified. IMPRESSION: 1. Progressive ground-glass attenuation and consolidation is noted within the posterolateral right upper lobe. New area of consolidation is also noted within the anterolateral right upper lobe. Findings are favored to represent multifocal pneumonia. Recommend follow-up imaging in 1 month following a trial of antibiotic therapy to ensure resolution and to exclude underlying malignancy. 2. The nodule of concern within the medial aspect of the superior segment of the right lower lobe is again noted. This measures approximately 2.2 x 1.3 cm, previously 1.9 x 1.1 cm. Findings are concerning for primary bronchogenic carcinoma. 3. Stable 0.8 cm nodule within the central aspect of the superior segment of the left lower lobe. 4. Lytic bone lesion involving the T5 vertebral body containing a small amount of gas is again noted. Previously attributed to infection, this appears similar in appearance to study from 01/23/2022 with signs of mild compression deformity. 5. Coronary artery calcification. 6.  Aortic Atherosclerosis (ICD10-I70.0). Electronically Signed   By: Signa Kell M.D.   On: 02/25/2023 11:57      IMPRESSION/PLAN: 1. 82 y.o. man with enlarging LLL pulmonary nodule, FDG-avid  Today, we talked to the patient and family about the findings and workup thus  far. We discussed the natural history of FDG-avid pulmonary nodules and general treatment, highlighting the role of radiotherapy in the management. We discussed the available radiation techniques, and focused on the details and logistics of delivery. We reviewed the anticipated acute and late sequelae associated with radiation in this setting. The patient was encouraged to ask questions that were answered to his satisfaction.  At the end of our conversation, the patient would like to proceed with stereotactic body radiotherapy.  He has been scheduled for CT simulation on 03/13/23. Anticipate 5 fractions of stereotactic body radiotherapy to the LLL.    I personally spent 60 minutes in this encounter including chart review, reviewing radiological studies, meeting face-to-face with the patient, entering orders and completing documentation.     Joyice Faster, PA-C    Margaretmary Dys, MD  [facility]  Radiation Oncology Direct Dial: [phone]  Fax: [phone] [email or web address]  Skype  [email or web address]   This document serves as a record of services personally performed by Margaretmary Dys, MD and Joyice Faster, PA-C. It was created on their behalf by Mickie Bail, a trained medical scribe. The creation of this record is based on the scribe's personal observations and the provider's statements to them. This document has been checked and approved by the attending provider.

## 2023-03-08 ENCOUNTER — Ambulatory Visit: Payer: Medicare Other | Admitting: Acute Care

## 2023-03-13 ENCOUNTER — Other Ambulatory Visit: Payer: Self-pay

## 2023-03-13 ENCOUNTER — Ambulatory Visit
Admission: RE | Admit: 2023-03-13 | Discharge: 2023-03-13 | Disposition: A | Discharge status: Home / Self Care | Payer: Medicare Other | Source: Ambulatory Visit | Attending: Radiation Oncology | Admitting: Radiation Oncology

## 2023-03-13 DIAGNOSIS — C3432 Malignant neoplasm of lower lobe, left bronchus or lung: Secondary | ICD-10-CM | POA: Diagnosis not present

## 2023-03-17 NOTE — Progress Notes (Signed)
  Radiation Oncology         [phone] ________________________________  Name: Cory Morris MRN: [id number]  Date: 03/18/2023  DOB: Aug 24, 1940  STEREOTACTIC BODY RADIOTHERAPY SIMULATION AND TREATMENT PLANNING NOTE    ICD-10-CM   1. 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 man with putative early stage non-small cell carcinama of the left lower lobe of the lung  NARRATIVE:  The patient was brought to the CT Simulation planning suite.  Identity was confirmed.  All relevant records and images related to the planned course of therapy were reviewed.  The patient freely provided informed written consent to proceed with treatment after reviewing the details related to the planned course of therapy. The consent form was witnessed and verified by the simulation staff.  Then, the patient was set-up in a stable reproducible  supine position for radiation therapy.  A BodyFix immobilization pillow was fabricated for reproducible positioning.  Then I personally applied the abdominal compression paddle to limit respiratory excursion.  4D respiratoy motion management CT images were obtained.  Surface markings were placed.  The CT images were loaded into the planning software.  Then, using Cine, MIP, and standard views, the internal target volume (ITV) and planning target volumes (PTV) were delinieated, and avoidance structures were contoured.  Treatment planning then occurred.  The radiation prescription was entered and confirmed.  A total of two complex treatment devices were fabricated in the form of the BodyFix immobilization pillow and a neck accuform cushion.  I have requested : 3D Simulation  I have requested a DVH of the following structures: Heart, Lungs, Esophagus, Chest Wall, Brachial Plexus, Major Blood Vessels, and targets.  SPECIAL TREATMENT PROCEDURE:  The planned course of therapy using radiation constitutes a special treatment  procedure. Special care is required in the management of this patient for the following reasons. This treatment constitutes a Special Treatment Procedure for the following reason: [ High dose per fraction requiring special monitoring for increased toxicities of treatment including daily imaging..  The special nature of the planned course of radiotherapy will require increased physician supervision and oversight to ensure patient's safety with optimal treatment outcomes.  This requires extended time and effort.    RESPIRATORY MOTION MANAGEMENT SIMULATION:  In order to account for effect of respiratory motion on target structures and other organs in the planning and delivery of radiotherapy, this patient underwent respiratory motion management simulation.  To accomplish this, when the patient was brought to the CT simulation planning suite, 4D respiratoy motion management CT images were obtained.  The CT images were loaded into the planning software.  Then, using a variety of tools including Cine, MIP, and standard views, the target volume and planning target volumes (PTV) were delineated.  Avoidance structures were contoured.  Treatment planning then occurred.  Dose volume histograms were generated and reviewed for each of the requested structure.  The resulting plan was carefully reviewed and approved today.  PLAN:  The patient will receive 60 Gy in 5 fractions.  ________________________________  Artist Pais Kathrynn Running, M.D.

## 2023-03-18 ENCOUNTER — Other Ambulatory Visit: Payer: Self-pay

## 2023-03-18 ENCOUNTER — Ambulatory Visit
Admission: RE | Admit: 2023-03-18 | Discharge: 2023-03-18 | Disposition: A | Discharge status: Home / Self Care | Payer: Medicare Other | Source: Ambulatory Visit | Attending: Radiation Oncology | Admitting: Radiation Oncology

## 2023-03-18 DIAGNOSIS — C3432 Malignant neoplasm of lower lobe, left bronchus or lung: Secondary | ICD-10-CM | POA: Insufficient documentation

## 2023-03-19 DIAGNOSIS — C3432 Malignant neoplasm of lower lobe, left bronchus or lung: Secondary | ICD-10-CM | POA: Diagnosis not present

## 2023-03-26 ENCOUNTER — Ambulatory Visit
Admission: RE | Admit: 2023-03-26 | Discharge: 2023-03-26 | Disposition: A | Discharge status: Home / Self Care | Payer: Medicare Other | Source: Ambulatory Visit | Attending: Radiation Oncology | Admitting: Radiation Oncology

## 2023-03-26 ENCOUNTER — Other Ambulatory Visit: Payer: Self-pay

## 2023-03-26 DIAGNOSIS — C3432 Malignant neoplasm of lower lobe, left bronchus or lung: Secondary | ICD-10-CM

## 2023-03-26 LAB — RAD ONC ARIA SESSION SUMMARY
Course Elapsed Days: 0
Plan Fractions Treated to Date: 1
Plan Prescribed Dose Per Fraction: 12 Gy
Plan Total Fractions Prescribed: 5
Plan Total Prescribed Dose: 60 Gy
Reference Point Dosage Given to Date: 12 Gy
Reference Point Session Dosage Given: 12 Gy
Session Number: 1

## 2023-03-27 ENCOUNTER — Ambulatory Visit: Payer: Medicare Other | Admitting: Radiation Oncology

## 2023-03-28 ENCOUNTER — Ambulatory Visit: Payer: Medicare Other | Admitting: Radiation Oncology

## 2023-03-29 ENCOUNTER — Ambulatory Visit: Payer: Medicare Other | Admitting: Radiation Oncology

## 2023-03-29 LAB — LAB REPORT - SCANNED: EGFR: 20

## 2023-04-01 ENCOUNTER — Ambulatory Visit: Payer: Medicare Other | Admitting: Radiation Oncology

## 2023-04-01 ENCOUNTER — Other Ambulatory Visit: Payer: Self-pay

## 2023-04-01 ENCOUNTER — Ambulatory Visit
Admission: RE | Admit: 2023-04-01 | Discharge: 2023-04-01 | Disposition: A | Discharge status: Home / Self Care | Payer: Medicare Other | Source: Ambulatory Visit | Attending: Radiation Oncology | Admitting: Radiation Oncology

## 2023-04-01 DIAGNOSIS — C3432 Malignant neoplasm of lower lobe, left bronchus or lung: Secondary | ICD-10-CM | POA: Diagnosis not present

## 2023-04-01 LAB — RAD ONC ARIA SESSION SUMMARY
Course Elapsed Days: 6
Plan Fractions Treated to Date: 2
Plan Prescribed Dose Per Fraction: 12 Gy
Plan Total Fractions Prescribed: 5
Plan Total Prescribed Dose: 60 Gy
Reference Point Dosage Given to Date: 24 Gy
Reference Point Session Dosage Given: 12 Gy
Session Number: 2

## 2023-04-03 ENCOUNTER — Ambulatory Visit: Payer: Medicare Other | Admitting: Radiation Oncology

## 2023-04-03 ENCOUNTER — Other Ambulatory Visit: Payer: Self-pay

## 2023-04-03 ENCOUNTER — Ambulatory Visit
Admission: RE | Admit: 2023-04-03 | Discharge: 2023-04-03 | Disposition: A | Discharge status: Home / Self Care | Payer: Medicare Other | Source: Ambulatory Visit | Attending: Radiation Oncology | Admitting: Radiation Oncology

## 2023-04-03 DIAGNOSIS — C3432 Malignant neoplasm of lower lobe, left bronchus or lung: Secondary | ICD-10-CM | POA: Diagnosis not present

## 2023-04-03 LAB — RAD ONC ARIA SESSION SUMMARY
Course Elapsed Days: 8
Plan Fractions Treated to Date: 3
Plan Prescribed Dose Per Fraction: 12 Gy
Plan Total Fractions Prescribed: 5
Plan Total Prescribed Dose: 60 Gy
Reference Point Dosage Given to Date: 36 Gy
Reference Point Session Dosage Given: 12 Gy
Session Number: 3

## 2023-04-04 ENCOUNTER — Encounter: Payer: Self-pay | Admitting: Nephrology

## 2023-04-05 ENCOUNTER — Ambulatory Visit
Admission: RE | Admit: 2023-04-05 | Discharge: 2023-04-05 | Disposition: A | Discharge status: Home / Self Care | Payer: Medicare Other | Source: Ambulatory Visit | Attending: Radiation Oncology | Admitting: Radiation Oncology

## 2023-04-05 ENCOUNTER — Ambulatory Visit: Admission: RE | Admit: 2023-04-05 | Payer: Medicare Other | Source: Ambulatory Visit

## 2023-04-05 ENCOUNTER — Other Ambulatory Visit: Payer: Self-pay

## 2023-04-05 ENCOUNTER — Ambulatory Visit: Payer: Medicare Other | Admitting: Radiation Oncology

## 2023-04-05 DIAGNOSIS — C3432 Malignant neoplasm of lower lobe, left bronchus or lung: Secondary | ICD-10-CM | POA: Diagnosis not present

## 2023-04-05 LAB — RAD ONC ARIA SESSION SUMMARY
Course Elapsed Days: 10
Plan Fractions Treated to Date: 4
Plan Prescribed Dose Per Fraction: 12 Gy
Plan Total Fractions Prescribed: 5
Plan Total Prescribed Dose: 60 Gy
Reference Point Dosage Given to Date: 48 Gy
Reference Point Session Dosage Given: 12 Gy
Session Number: 4

## 2023-04-08 ENCOUNTER — Ambulatory Visit: Payer: Medicare Other | Admitting: Pulmonary Disease

## 2023-04-09 ENCOUNTER — Ambulatory Visit
Admission: RE | Admit: 2023-04-09 | Discharge: 2023-04-09 | Disposition: A | Discharge status: Home / Self Care | Payer: Medicare Other | Source: Ambulatory Visit | Attending: Radiation Oncology | Admitting: Radiation Oncology

## 2023-04-09 ENCOUNTER — Other Ambulatory Visit: Payer: Self-pay

## 2023-04-09 DIAGNOSIS — C3432 Malignant neoplasm of lower lobe, left bronchus or lung: Secondary | ICD-10-CM

## 2023-04-09 LAB — RAD ONC ARIA SESSION SUMMARY
Course Elapsed Days: 14
Plan Fractions Treated to Date: 5
Plan Prescribed Dose Per Fraction: 12 Gy
Plan Total Fractions Prescribed: 5
Plan Total Prescribed Dose: 60 Gy
Reference Point Dosage Given to Date: 60 Gy
Reference Point Session Dosage Given: 12 Gy
Session Number: 5

## 2023-04-11 NOTE — Radiation Completion Notes (Addendum)
  Radiation Oncology         [phone]) [phone] ________________________________  Name: Cory Morris MRN: [id number]  Date: 04/09/2023  DOB: 10-01-1940  Referring Physician: ADINE GIFT, M.D. Date of Service: 2023-04-11 Radiation Oncologist: Adina Barge, M.D. [facility]     RADIATION ONCOLOGY END OF TREATMENT NOTE     Diagnosis: C34.32 Malignant neoplasm of lower lobe, left bronchus or lung Intent: Curative     ==========DELIVERED PLANS==========  First Treatment Date: 2023-03-26 - Last Treatment Date: 2023-04-09   Plan Name: Lung_L_SBRT Site: Lung, Left Technique: SBRT/SRT-IMRT Mode: Photon Dose Per Fraction: 12 Gy Prescribed Dose (Delivered / Prescribed): 60 Gy / 60 Gy Prescribed Fxs (Delivered / Prescribed): 5 / 5     ==========ON TREATMENT VISIT DATES========== 2023-03-26, 2023-04-01, 2023-04-03, 2023-04-05, 2023-04-05, 2023-04-09   The patient will receive a call in about one month from the radiation oncology department. He will continue follow up with his pulmonologist, Dr. GIFT, as well.  ------------------------------------------------   [name], MD [facility]  Radiation Oncology Direct Dial: [phone]  Fax: [phone] [email or web address]  Skype  LinkedIn

## 2023-04-29 ENCOUNTER — Emergency Department (HOSPITAL_COMMUNITY)
Admission: EM | Admit: 2023-04-29 | Discharge: 2023-04-29 | Disposition: A | Discharge status: Home / Self Care | Payer: Medicare Other | Attending: Emergency Medicine | Admitting: Emergency Medicine

## 2023-04-29 DIAGNOSIS — I509 Heart failure, unspecified: Secondary | ICD-10-CM | POA: Insufficient documentation

## 2023-04-29 DIAGNOSIS — N179 Acute kidney failure, unspecified: Secondary | ICD-10-CM | POA: Diagnosis not present

## 2023-04-29 DIAGNOSIS — Z7901 Long term (current) use of anticoagulants: Secondary | ICD-10-CM | POA: Diagnosis not present

## 2023-04-29 DIAGNOSIS — I959 Hypotension, unspecified: Secondary | ICD-10-CM | POA: Insufficient documentation

## 2023-04-29 LAB — URINALYSIS, ROUTINE W REFLEX MICROSCOPIC
Bacteria, UA: NONE SEEN
Bilirubin Urine: NEGATIVE
Glucose, UA: NEGATIVE mg/dL
Ketones, ur: NEGATIVE mg/dL
Leukocytes,Ua: NEGATIVE
Nitrite: NEGATIVE
Protein, ur: 30 mg/dL — AB
Specific Gravity, Urine: 1.009 (ref 1.005–1.030)
pH: 5 (ref 5.0–8.0)

## 2023-04-29 LAB — BASIC METABOLIC PANEL
Anion gap: 15 (ref 5–15)
BUN: 70 mg/dL — ABNORMAL HIGH (ref 8–23)
CO2: 25 mmol/L (ref 22–32)
Calcium: 8 mg/dL — ABNORMAL LOW (ref 8.9–10.3)
Chloride: 94 mmol/L — ABNORMAL LOW (ref 98–111)
Creatinine, Ser: 3.18 mg/dL — ABNORMAL HIGH (ref 0.61–1.24)
GFR, Estimated: 19 mL/min — ABNORMAL LOW (ref >60)
Glucose, Bld: 107 mg/dL — ABNORMAL HIGH (ref 70–99)
Potassium: 3.6 mmol/L (ref 3.5–5.1)
Sodium: 134 mmol/L — ABNORMAL LOW (ref 135–145)

## 2023-04-29 LAB — CBC
HCT: 38.5 % — ABNORMAL LOW (ref 39.0–52.0)
Hemoglobin: 12.3 g/dL — ABNORMAL LOW (ref 13.0–17.0)
MCH: 29.6 pg (ref 26.0–34.0)
MCHC: 31.9 g/dL (ref 30.0–36.0)
MCV: 92.5 fL (ref 80.0–100.0)
Platelets: 218 10*3/uL (ref 150–400)
RBC: 4.16 MIL/uL — ABNORMAL LOW (ref 4.22–5.81)
RDW: 16 % — ABNORMAL HIGH (ref 11.5–15.5)
WBC: 11.6 10*3/uL — ABNORMAL HIGH (ref 4.0–10.5)
nRBC: 0 % (ref 0.0–0.2)

## 2023-04-29 LAB — CBG MONITORING, ED: Glucose-Capillary: 108 mg/dL — ABNORMAL HIGH (ref 70–99)

## 2023-04-29 LAB — TROPONIN I (HIGH SENSITIVITY)
Troponin I (High Sensitivity): 24 ng/L — ABNORMAL HIGH (ref <18)
Troponin I (High Sensitivity): 20 ng/L — ABNORMAL HIGH (ref <18)

## 2023-04-29 MED ORDER — LACTATED RINGERS IV BOLUS
1000.0000 mL | Freq: Once | INTRAVENOUS | Status: AC
  Administered 2023-04-29: 1000 mL via INTRAVENOUS

## 2023-04-29 MED ORDER — LACTATED RINGERS IV BOLUS: 1000.0000 mL | Freq: Once | INTRAVENOUS | Status: DC

## 2023-04-29 NOTE — ED Triage Notes (Signed)
Pt  to the ed from [facility] with a CC hypotension pt had a BP of 76/50 for UC. Pt has recently increased his lasix. Pt is dizzy and weak when walking. Pt did not fall. Pt denies CP, LOC at this time.

## 2023-04-29 NOTE — Discharge Instructions (Signed)
As discussed stop using your torsemide until you follow-up with your PCP or nephrology. You will need to get your kidney functions reevaluated in the next 2 to 3 days. Contact a health care provider if: Your symptoms get worse. You have new symptoms, such as: Headaches. Skin that is darker or lighter than normal. Easy bruising. Feeling itchy. Hiccups. Lack of menstrual periods. You have a fever. Get help right away if: You have signs of severe kidney disease, such as: Chest pain. Shortness of breath. Seizures. You have pain or bleeding when you pass urine. You make little or no urine. These symptoms may be an emergency. Get help right away. Call 911. Do not wait to see if the symptoms will go away. Do not drive yourself to the hospital.

## 2023-04-29 NOTE — ED Provider Notes (Signed)
[place] EMERGENCY DEPARTMENT AT [facility] Provider Note   CSN: [id number] Arrival date & time: 04/29/23  1559     History  Chief Complaint  Patient presents with   Hypotension    Cory Morris is a 82 y.o. male who was sent into the emergency department with a chief complaint of hypotension. He has a past medical history of chronic atrial fibrillation on Eliquis, Heart failure.  History is given by the patient and his daughter at bedside.  Patient's daughter states that on Friday he was not feeling well and he fell onto his bottom but did not hit his head.  He is been fine and had normal vital signs throughout the weekend.  She states his blood pressure is usually fairly low.  Today he went in for routine check and his doctors office to have a systolic blood pressure in the 70s.  Patient states that he is feeling well.  He denies any melena or hematochezia, shortness of breath.  He did have an increase in his torsemide from taking it 3 times a week to daily but denies any increased urinary output. HPI     Home Medications Prior to Admission medications   Medication Sig Start Date End Date Taking? Authorizing Provider  albuterol (VENTOLIN HFA) 108 (90 Base) MCG/ACT inhaler INHALE TWO PUFFS BY MOUTH INTO LUNGS EVERY 6 HOURS AS NEEDED FOR WHEEZING/SHORTNESS OF BREATH 09/20/22   Mannam, Praveen, MD  allopurinol (ZYLOPRIM) 100 MG tablet Take 100 mg by mouth.    [provider]  atorvastatin (LIPITOR) 80 MG tablet Take 80 mg by mouth daily.    [provider]  Biotin 1000 MCG CHEW Chew 1,000 mcg by mouth daily.    [provider]  cholecalciferol (VITAMIN D3) 25 MCG (1000 UNIT) tablet Take 1,000 Units by mouth daily.    [provider]  diltiazem (CARDIZEM CD) 120 MG 24 hr capsule Take 120 mg by mouth daily.    [provider]  ELIQUIS 2.5 MG TABS tablet Take 2.5 mg by mouth 2 (two) times daily. 09/24/21   [provider]   Fluticasone-Umeclidin-Vilant (TRELEGY ELLIPTA) 200-62.5-25 MCG/ACT AEPB INHALE 1 PUFF BY MOUTH INTO LUNGS DAILY 11/08/22   Mannam, Praveen, MD  ipratropium-albuterol (DUONEB) 0.5-2.5 (3) MG/3ML SOLN Take 3 mLs by nebulization every 6 (six) hours as needed. 12/21/22   Mannam, Colbert Coyer, MD  latanoprost (XALATAN) 0.005 % ophthalmic solution Place 1 drop into both eyes at bedtime.    [provider]  metoprolol tartrate (LOPRESSOR) 100 MG tablet Take 100 mg by mouth 2 (two) times daily.    [provider]  montelukast (SINGULAIR) 10 MG tablet Take 10 mg by mouth daily. 08/19/22   [provider]  Probiotic Product (PROBIOTIC DAILY PO) Take 220 mg by mouth daily. 60 billions probiotic 10 strains    [provider]  tiZANidine (ZANAFLEX) 2 MG tablet Take 2 mg by mouth every 6 (six) hours as needed for muscle spasms.    [provider]  torsemide (DEMADEX) 20 MG tablet Take 10 mg by mouth every Monday, Wednesday, and Friday.    [provider]      Allergies    Ace inhibitors and Dronedarone    Review of Systems   Review of Systems  Physical Exam Updated Vital Signs BP 110/72   Pulse 93   Temp 97.8 F (36.6 C) (Oral)   Resp 19   Ht 5\' 7"  (1.702 m)   Wt 80  kg   SpO2 98%   BMI 27.62 kg/m  Physical Exam Vitals and nursing note reviewed.  Constitutional:      General: He is not in acute distress.    Appearance: He is well-developed. He is not diaphoretic.  HENT:     Head: Normocephalic and atraumatic.  Eyes:     General: No scleral icterus.    Conjunctiva/sclera: Conjunctivae normal.  Cardiovascular:     Rate and Rhythm: Normal rate and regular rhythm.     Heart sounds: Normal heart sounds.  Pulmonary:     Effort: Pulmonary effort is normal. No respiratory distress.     Breath sounds: Normal breath sounds.  Abdominal:     Palpations: Abdomen is soft.     Tenderness: There is no abdominal tenderness.  Musculoskeletal:     Cervical  back: Normal range of motion and neck supple.  Skin:    General: Skin is warm and dry.  Neurological:     Mental Status: He is alert.  Psychiatric:        Behavior: Behavior normal.     ED Results / Procedures / Treatments   Labs (all labs ordered are listed, but only abnormal results are displayed) Labs Reviewed  CBC - Abnormal; Notable for the following components:      Result Value   WBC 11.6 (*)    RBC 4.16 (*)    Hemoglobin 12.3 (*)    HCT 38.5 (*)    RDW 16.0 (*)    All other components within normal limits  CBG MONITORING, ED - Abnormal; Notable for the following components:   Glucose-Capillary 108 (*)    All other components within normal limits  BASIC METABOLIC PANEL  URINALYSIS, ROUTINE W REFLEX MICROSCOPIC  TROPONIN I (HIGH SENSITIVITY)    EKG None  Radiology No results found.  Procedures Procedures    Medications Ordered in ED Medications - No data to display  ED Course/ Medical Decision Making/ A&P Clinical Course as of 04/29/23 2134  Mon Apr 29, 2023  2013 Creatinine(!): 3.18 [AH]  2013 BUN(!): 70 [AH]  2014 Basic metabolic panel(!) [AH]  2050 Troponin I (High Sensitivity)(!): 20 Troponins are stable  [AH]    Clinical Course User Index [AH] Arthor Captain, PA-C                                 Medical Decision Making This is a 82 year old man sent in for hypotension.  He has had an increased dose of his Lasix over the past 2 months.  Patient is having no symptoms and has negative vital signs here.  I doubt any other underlying cause such as sepsis or blood pressure medication overdose.  Ordered labs colluding troponin which is flat, BMP shows significant findings of creatinine of 3.18 up from his baseline of closer to 2 and a BUN of 70 which is almost double recent findings.  White blood cell count mildly elevated at 11.6 hemoglobin shows chronic normocytic anemia.  Urine shows no evidence of infection.  I reviewed all findings with the  patient and his daughter at bedside.  Suspect that he has acute kidney injury in the setting of improved creased use of his torsemide.  They both would prefer not to be admitted for acute kidney injury.  In shared decision making with the patient and his daughter they have decided to receive a bolus of fluids here with instructions to have  close outpatient follow-up for reevaluation of kidney function in the next 2 to 3 days with either PCP or nephrology along with discontinuation of torsemide at this time.  They are willing to follow through with these instructions and I have discussed reasons to seek immediate medical care in the emergency department.  EKG reviewed shows rate controlled A-fib at a rate of 93    Amount and/or Complexity of Data Reviewed Labs:  Decision-making details documented in ED Course.           Final Clinical Impression(s) / ED Diagnoses Final diagnoses:  None    Rx / DC Orders ED Discharge Orders     None         Arthor Captain, PA-C 04/29/23 2148    Gloris Manchester, MD 04/29/23 2352

## 2023-06-09 ENCOUNTER — Other Ambulatory Visit: Payer: Self-pay | Admitting: Pulmonary Disease

## 2023-06-21 ENCOUNTER — Telehealth (INDEPENDENT_AMBULATORY_CARE_PROVIDER_SITE_OTHER): Payer: Medicare Other | Admitting: Primary Care

## 2023-06-21 DIAGNOSIS — J439 Emphysema, unspecified: Secondary | ICD-10-CM

## 2023-06-21 DIAGNOSIS — J449 Chronic obstructive pulmonary disease, unspecified: Secondary | ICD-10-CM | POA: Diagnosis not present

## 2023-06-21 DIAGNOSIS — R911 Solitary pulmonary nodule: Secondary | ICD-10-CM

## 2023-06-21 DIAGNOSIS — Z87891 Personal history of nicotine dependence: Secondary | ICD-10-CM | POA: Diagnosis not present

## 2023-06-21 NOTE — Progress Notes (Unsigned)
Virtual Visit via Video Note  I connected with Francene Castle on 06/21/23 at  3:00 PM EST by a video enabled telemedicine application and verified that I am speaking with the correct person using two identifiers.  Location: Patient: Home Provider: Office   I discussed the limitations of evaluation and management by telemedicine and the availability of in person appointments. The patient expressed understanding and agreed to proceed.  History of Present Illness:  82 year old male. Patient of Dr. Tonia Brooms, last seen in office on 12/10/2022.  He is followed for COPD management by Dr. Isaiah Serge.  Referred to Dr. Tonia Brooms for abnormal CT chest with left lower lobe pulmonary nodule that was hypermetabolic on PET imaging concerning for potential underlying malignancy.  Patient felt likely to benefit from bronchoscopy and biopsy.  Needing cardiac clearance from Dr. [name] for general anesthesia.  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 Diuretics were recently increased.  Patient had a super D chest CT on 02/25/2023 prior to bronchoscopy procedure showing progressive groundglass attenuation and new consolidation within right upper lobe, favored to represent multifocal pneumonia.  Given these findings, bronchoscopy was canceled.  Dr. Tonia Brooms recommending proceeding with SBRT without tissue diagnosis. Patient was referred to radiation oncology for SBRT for the treatment of primary non-small cell lung cancer to lower lobe of the left lung.  He has had 5 treatments.  Patient was contacted by virtual video visit for follow-up. Accompanied by his daughter. Mr. Bayse is doing well today. They have no immediate concerns, his daughter is wondering on timing of follow-up CT scan. They cancelled a post treatment video call with radiation/oncology because they were unsure the reason for the visit. He is doing well respiratory wise. He is compliant with Trelegy, uses nebulizer once a  day. He does not get short of breath too frequency. He has a slight congested cough which out much sputum production. No hemoptysis. He needs nebulizer supplies renewed.    Observations/Objective:  Appears well; Answering all questions appropriate. No acute concern or respiratory symptoms   Assessment and Plan:  Left lower lobe pulmonary nodule - Completed SBRT end of August, he had total of 5 treatments  - Following with Radiation/Oncology - I will send a message to Dr. Kathrynn Running to see if they will be ordering surveillance scan or if they would like our office to  COPD - Stable; Continue Trelegy daily  - Renew nebulizer supplies with DME   Follow Up Instructions:   - 3 months with Dr. Isaiah Serge for COPD management  I discussed the assessment and treatment plan with the patient. The patient was provided an opportunity to ask questions and all were answered. The patient agreed with the plan and demonstrated an understanding of the instructions.   The patient was advised to call back or seek an in-person evaluation if the symptoms worsen or if the condition fails to improve as anticipated.  I provided 25 minutes of non-face-to-face time during this encounter.   Glenford Bayley, NP

## 2023-06-23 ENCOUNTER — Telehealth: Payer: Self-pay | Admitting: Primary Care

## 2023-06-23 NOTE — Telephone Encounter (Signed)
Can we leave a couple nebulizer supplies in a bag up front for Mr Wethington

## 2023-06-26 NOTE — Telephone Encounter (Signed)
Called. Spoke with patients daughter, [name], (on [place]).  Informed will leave 2 nebulizer supply kits in a bag at front desk.  She will come by office this afternoon to pick up.  Larita Fife verbalized understanding. Nothing further needed at this time.

## 2023-07-01 ENCOUNTER — Ambulatory Visit: Payer: Medicare Other | Attending: Internal Medicine | Admitting: Internal Medicine

## 2023-07-01 ENCOUNTER — Encounter: Payer: Self-pay | Admitting: Internal Medicine

## 2023-07-01 VITALS — BP 108/78 | HR 91 | Ht 67.0 in | Wt 177.6 lb

## 2023-07-01 DIAGNOSIS — Z7901 Long term (current) use of anticoagulants: Secondary | ICD-10-CM | POA: Diagnosis present

## 2023-07-01 DIAGNOSIS — Z136 Encounter for screening for cardiovascular disorders: Secondary | ICD-10-CM | POA: Insufficient documentation

## 2023-07-01 DIAGNOSIS — I700 Atherosclerosis of aorta: Secondary | ICD-10-CM | POA: Diagnosis present

## 2023-07-01 DIAGNOSIS — I5032 Chronic diastolic (congestive) heart failure: Secondary | ICD-10-CM | POA: Insufficient documentation

## 2023-07-01 DIAGNOSIS — I4821 Permanent atrial fibrillation: Secondary | ICD-10-CM | POA: Insufficient documentation

## 2023-07-01 NOTE — Patient Instructions (Signed)
Medication Instructions:  No changes.    Follow-Up: At [facility], you and your health needs are our priority.  As part of our continuing mission to provide you with exceptional heart care, we have created designated Provider Care Teams.  These Care Teams include your primary Cardiologist (physician) and Advanced Practice Providers (APPs -  Physician Assistants and Nurse Practitioners) who all work together to provide you with the care you need, when you need it.  We recommend signing up for the patient portal called "MyChart".  Sign up information is provided on this After Visit Summary.  MyChart is used to connect with patients for Virtual Visits (Telemedicine).  Patients are able to view lab/test results, encounter notes, upcoming appointments, etc.  Non-urgent messages can be sent to your provider as well.   To learn more about what you can do with MyChart, go to [email or web address].    Your next appointment:   6 month(s)  Provider:   [name], NP

## 2023-07-01 NOTE — Progress Notes (Signed)
OFFICE NOTE  Chief Complaint:  Follow-up  Primary Care Physician: Theodis Shove, DO  HPI:  Cory Morris is a 82 y.o. male with a past medial history significant for A. fib, COPD, stage IV chronic kidney disease, depression, gout and aortic atherosclerosis, who presents to establish cardiac care.  Cory Morris is from the [place] area, specifically [place].  He is accompanied by his daughter today.  She and her husband moved to the area a few years ago and recently moved him down here.  He previously was getting care at the [facility] clinic and his cardiologist was Dr. Georgeann Oppenheim.  With regards to his A. fib, it is likely he has permanent A. fib at this point.  He has reportedly had multiple prior cardioversions and ablation.  I will need to request those records.  He denies any obstructive coronary history such as CABG or prior PCI.  He reported he had a fall last evening.  He says he has had several falls over the past several years.  This occurred when getting out of his recliner.  He said he stood up and took a step and then became somewhat dizzy and fell.  He denies loss of consciousness.  He said he felt like the room might have been spinning which is suggestive of possible positional vertigo.  Does use a walker and has some marked kyphosis and is getting home PT.  07/01/2023  Cory Morris returns today for follow-up.  He recently was seen in the emergency department and was possibly over diuresed.  His creatinine was elevated consistent with prerenal state.  Blood pressure was apparently low in his doctor's office.  His most recent echo showed LVEF 50 to 55% but primarily RV dysfunction.  He did have repeat metabolic profile which showed improvement in his renal function.  He is currently on torsemide 20 mg 3 times a week.  He has had lab work through [facility], which is the geriatric primary care practice that he uses.  PMHx:  Past Medical History:  Diagnosis Date    Aortic atherosclerosis (HCC)    CKD (chronic kidney disease) stage 4, GFR 15-29 ml/min (HCC)    COPD (chronic obstructive pulmonary disease) (HCC)    Dysrhythmia    Family history of adverse reaction to anesthesia    his 2 daughters have N/V   Gout    Hypertension    Permanent atrial fibrillation (HCC)    Prediabetes     Past Surgical History:  Procedure Laterality Date   ATRIAL FIBRILLATION ABLATION      FAMHx:  Family History  Problem Relation Age of Onset   Atrial fibrillation Neg Hx    Arrhythmia Neg Hx     SOCHx:   reports that he has quit smoking. His smoking use included cigarettes. He has a 60 pack-year smoking history. He has never used smokeless tobacco. He reports current alcohol use. He reports that he does not use drugs.  ALLERGIES:  Allergies  Allergen Reactions   Ace Inhibitors Other (See Comments)    Unknown   Dronedarone Other (See Comments)    Unknown    ROS: Pertinent items noted in HPI and remainder of comprehensive ROS otherwise negative.  HOME MEDS: Current Outpatient Medications on File Prior to Visit  Medication Sig Dispense Refill   albuterol (VENTOLIN HFA) 108 (90 Base) MCG/ACT inhaler INHALE TWO PUFFS BY MOUTH INTO LUNGS EVERY 6 HOURS AS NEEDED FOR WHEEZING/SHORTNESS OF BREATH 18 g 5  allopurinol (ZYLOPRIM) 100 MG tablet Take 100 mg by mouth.     Biotin 1000 MCG CHEW Chew 1,000 mcg by mouth daily.     cholecalciferol (VITAMIN D3) 25 MCG (1000 UNIT) tablet Take 1,000 Units by mouth daily.     diltiazem (CARDIZEM CD) 120 MG 24 hr capsule Take 120 mg by mouth daily.     ELIQUIS 2.5 MG TABS tablet Take 2.5 mg by mouth 2 (two) times daily.     ipratropium-albuterol (DUONEB) 0.5-2.5 (3) MG/3ML SOLN Take 3 mLs by nebulization every 6 (six) hours as needed. 360 mL 5   latanoprost (XALATAN) 0.005 % ophthalmic solution Place 1 drop into both eyes at bedtime.     metoprolol tartrate (LOPRESSOR) 100 MG tablet Take 100 mg by mouth 2 (two) times daily.      montelukast (SINGULAIR) 10 MG tablet Take 10 mg by mouth daily.     Probiotic Product (PROBIOTIC DAILY PO) Take 220 mg by mouth daily. 60 billions probiotic 10 strains     torsemide (DEMADEX) 20 MG tablet Take 10 mg by mouth every Monday, Wednesday, and Friday.     TRELEGY ELLIPTA 200-62.5-25 MCG/ACT AEPB INHALE 1 PUFF BY MOUTH INTO LUNGS DAILY 60 each 5   atorvastatin (LIPITOR) 80 MG tablet Take 80 mg by mouth daily. (Patient not taking: Reported on 07/01/2023)     tiZANidine (ZANAFLEX) 2 MG tablet Take 2 mg by mouth every 6 (six) hours as needed for muscle spasms. (Patient not taking: Reported on 07/01/2023)     No current facility-administered medications on file prior to visit.    LABS/IMAGING: No results found for this or any previous visit (from the past 48 hour(s)). No results found.  LIPID PANEL: No results found for: "CHOL", "TRIG", "HDL", "CHOLHDL", "VLDL", "LDLCALC", "LDLDIRECT"   WEIGHTS: Wt Readings from Last 3 Encounters:  07/01/23 177 lb 9.6 oz (80.6 kg)  04/29/23 176 lb 5.9 oz (80 kg)  03/04/23 177 lb 6 oz (80.5 kg)    VITALS: BP 108/78 (BP Location: Left Arm, Patient Position: Sitting, Cuff Size: Normal)   Pulse 91   Ht 5\' 7"  (1.702 m)   Wt 177 lb 9.6 oz (80.6 kg)   SpO2 92%   BMI 27.82 kg/m   EXAM: General appearance: alert, no distress, and marked kyphosis Neck: no carotid bruit, no JVD, and thyroid not enlarged, symmetric, no tenderness/mass/nodules Lungs: diminished breath sounds bilaterally Heart: irregularly irregular rhythm Abdomen: soft, non-tender; bowel sounds normal; no masses,  no organomegaly Extremities: extremities normal, atraumatic, no cyanosis or edema and pain with palpation over the right lower posterior ribs, no ecchymosis Pulses: 2+ and symmetric Skin: Skin color, texture, turgor normal. No rashes or lesions Neurologic: Grossly normal Psych: Pleasant  EKG: EKG Interpretation Date/Time:  Monday July 01 2023 10:22:53  EST Ventricular Rate:  100 PR Interval:    QRS Duration:  134 QT Interval:  376 QTC Calculation: 485 R Axis:   117  Text Interpretation: Atrial fibrillation Right bundle branch block Right ventricular hypertrophy T wave abnormality, consider anterolateral ischemia When compared with ECG of 29-Apr-2023 16:06, No significant change since last tracing Confirmed by Zoila Shutter [phone]) on 07/01/2023 10:27:13 AM    ASSESSMENT: Probable permanent atrial fibrillation LVEF 50 to 55%, moderate RV dysfunction, severe biatrial enlargement, severe TR, mild AI (01/2023) Aortic atherosclerosis CKD 4  PLAN: 1.   Cory Morris seems to be doing well from a cardiac standpoint.  He is unaware of the A-fib.  He had  some acute on chronic renal insufficiency recently may be related to an increase in his diuretic.  He has had some lower blood pressures but not clearly symptomatic with that.  Rate appears to be reasonably controlled on diltiazem and metoprolol with regards to A-fib.  There is a concern about whether he may be having some muscle aches or weakness related to his statin.  I advised a 2-week statin holiday to see if those symptoms improved.  If they do not he should resume his statin.  If they do we could consider perhaps lowering the dose based on repeat lab work which is already scheduled through his primary care provider.  Plan otherwise follow-up with me in 6 months or sooner as necessary.  Chrystie Nose, MD, [facility], FACP  [place]  Medical Director of the Advanced Lipid Disorders &  Cardiovascular Risk Reduction Clinic Diplomate of the American Board of Clinical Lipidology Attending Cardiologist  Direct Dial: [phone]  Fax: [phone]  Website:  [email or web address] Akane Tessier 07/01/2023, 10:27 AM

## 2023-07-19 DIAGNOSIS — I739 Peripheral vascular disease, unspecified: Secondary | ICD-10-CM | POA: Insufficient documentation

## 2023-08-10 LAB — LAB REPORT - SCANNED: EGFR: 21

## 2023-08-13 ENCOUNTER — Encounter: Payer: Self-pay | Admitting: Geriatric Medicine

## 2023-09-05 LAB — LAB REPORT - SCANNED
Albumin, Urine POC: 203.8
Calcium: 8.7
Creatinine, POC: 91.9 mg/dL
EGFR: 28
Microalb Creat Ratio: 222

## 2023-09-07 ENCOUNTER — Encounter: Payer: Self-pay | Admitting: Internal Medicine

## 2023-09-09 NOTE — Telephone Encounter (Signed)
Attempted to call patients daughter Larita Fife, no answer, left message requesting call back.  Mychart message also sent

## 2023-09-09 NOTE — Telephone Encounter (Signed)
Patient identification verified by 2 forms. Marilynn Rail, RN    Called and spoke to patient daughter Milagros Loll states:   -patient has swelling in legs since November   -swelling increases and decreases   -swelling has not worsened   -Friday swelling increased to his knees and upper leg  -Torsemide was changed to 20mg  daily on 09/04/23, by Nephrologist   -Nephrologist is concerned that diltiazem is causing swelling   -Patient in permament Afib, resting heart rate averages 113BPM Larita Fife denise:   -SOB/difficulty breathing   -Chest pain/tightness   -C/O palpitation  Informed Larita Fife message sent to Dr. [name] for input/advisement  Larita Fife Verbalized understanding, no questions at this time

## 2023-09-09 NOTE — Telephone Encounter (Signed)
Chrystie Nose, MD to Me  Lindell Spar, RN  Anthony Sar, MD     09/09/23  9:41 AM  I doubt it is the diltiazem causing swelling, but might contribute - his recent echo showed right heart failure - I suspect progression of that.  He will likely need more diuretic - needs office visit for evaluation and med adjustment.  Dr. Oleta Mouse

## 2023-09-10 ENCOUNTER — Other Ambulatory Visit: Payer: Self-pay | Admitting: Nephrology

## 2023-09-10 DIAGNOSIS — R609 Edema, unspecified: Secondary | ICD-10-CM

## 2023-09-12 ENCOUNTER — Ambulatory Visit
Admission: RE | Admit: 2023-09-12 | Discharge: 2023-09-12 | Disposition: A | Discharge status: Home / Self Care | Payer: Medicare Other | Source: Ambulatory Visit | Attending: Nephrology | Admitting: Nephrology

## 2023-09-12 DIAGNOSIS — R609 Edema, unspecified: Secondary | ICD-10-CM

## 2023-09-13 ENCOUNTER — Encounter: Payer: Self-pay | Admitting: Internal Medicine

## 2023-09-13 ENCOUNTER — Ambulatory Visit: Payer: Medicare Other | Attending: Internal Medicine | Admitting: Internal Medicine

## 2023-09-13 VITALS — BP 94/65 | HR 80 | Ht 67.0 in | Wt 168.0 lb

## 2023-09-13 DIAGNOSIS — Z79899 Other long term (current) drug therapy: Secondary | ICD-10-CM

## 2023-09-13 DIAGNOSIS — I5032 Chronic diastolic (congestive) heart failure: Secondary | ICD-10-CM | POA: Diagnosis not present

## 2023-09-13 MED ORDER — TORSEMIDE 20 MG PO TABS: 20.0000 mg | ORAL_TABLET | Freq: Two times a day (BID) | ORAL | 5 refills | Status: DC

## 2023-09-13 NOTE — Patient Instructions (Addendum)
Medication Instructions:  Increase Torsemide 20 mg twice daily  *If you need a refill on your cardiac medications before your next appointment, please call your pharmacy*   Lab Work: BMET in 1 week.    Testing/Procedures: NONE ordered at this time of appointment   Follow-Up: At [facility], you and your health needs are our priority.  As part of our continuing mission to provide you with exceptional heart care, we have created designated Provider Care Teams.  These Care Teams include your primary Cardiologist (physician) and Advanced Practice Providers (APPs -  Physician Assistants and Nurse Practitioners) who all work together to provide you with the care you need, when you need it.  We recommend signing up for the patient portal called "MyChart".  Sign up information is provided on this After Visit Summary.  MyChart is used to connect with patients for Virtual Visits (Telemedicine).  Patients are able to view lab/test results, encounter notes, upcoming appointments, etc.  Non-urgent messages can be sent to your provider as well.   To learn more about what you can do with MyChart, go to [email or web address].    Your next appointment:    3 month f/u with Dr. [name] or APP  Provider:   Chrystie Nose, MD

## 2023-09-13 NOTE — Progress Notes (Unsigned)
OFFICE NOTE  Chief Complaint:  Follow-up  Primary Care Physician: Theodis Shove, DO  HPI:  Cory Morris is a 83 y.o. male with a past medial history significant for A. fib, COPD, stage IV chronic kidney disease, depression, gout and aortic atherosclerosis, who presents to establish cardiac care.  Cory Morris is from the [place] area, specifically [place].  He is accompanied by his daughter today.  She and her husband moved to the area a few years ago and recently moved him down here.  He previously was getting care at the [facility] clinic and his cardiologist was Dr. Georgeann Oppenheim.  With regards to his A. fib, it is likely he has permanent A. fib at this point.  He has reportedly had multiple prior cardioversions and ablation.  I will need to request those records.  He denies any obstructive coronary history such as CABG or prior PCI.  He reported he had a fall last evening.  He says he has had several falls over the past several years.  This occurred when getting out of his recliner.  He said he stood up and took a step and then became somewhat dizzy and fell.  He denies loss of consciousness.  He said he felt like the room might have been spinning which is suggestive of possible positional vertigo.  Does use a walker and has some marked kyphosis and is getting home PT.  07/01/2023  Cory Morris returns today for follow-up.  He recently was seen in the emergency department and was possibly over diuresed.  His creatinine was elevated consistent with prerenal state.  Blood pressure was apparently low in his doctor's office.  His most recent echo showed LVEF 50 to 55% but primarily RV dysfunction.  He did have repeat metabolic profile which showed improvement in his renal function.  He is currently on torsemide 20 mg 3 times a week.  He has had lab work through [facility], which is the geriatric primary care practice that he uses.  PMHx:  Past Medical History:  Diagnosis Date    Aortic atherosclerosis (HCC)    CKD (chronic kidney disease) stage 4, GFR 15-29 ml/min (HCC)    COPD (chronic obstructive pulmonary disease) (HCC)    Dysrhythmia    Family history of adverse reaction to anesthesia    his 2 daughters have N/V   Gout    Hypertension    Permanent atrial fibrillation (HCC)    Prediabetes     Past Surgical History:  Procedure Laterality Date   ATRIAL FIBRILLATION ABLATION      FAMHx:  Family History  Problem Relation Age of Onset   Atrial fibrillation Neg Hx    Arrhythmia Neg Hx     SOCHx:   reports that he has quit smoking. His smoking use included cigarettes. He has a 60 pack-year smoking history. He has never used smokeless tobacco. He reports current alcohol use. He reports that he does not use drugs.  ALLERGIES:  Allergies  Allergen Reactions   Ace Inhibitors Other (See Comments)    Unknown   Dronedarone Other (See Comments)    Unknown    ROS: Pertinent items noted in HPI and remainder of comprehensive ROS otherwise negative.  HOME MEDS: Current Outpatient Medications on File Prior to Visit  Medication Sig Dispense Refill   albuterol (VENTOLIN HFA) 108 (90 Base) MCG/ACT inhaler INHALE TWO PUFFS BY MOUTH INTO LUNGS EVERY 6 HOURS AS NEEDED FOR WHEEZING/SHORTNESS OF BREATH 18 g 5  allopurinol (ZYLOPRIM) 100 MG tablet Take 100 mg by mouth.     Biotin 1000 MCG CHEW Chew 1,000 mcg by mouth daily.     cholecalciferol (VITAMIN D3) 25 MCG (1000 UNIT) tablet Take 1,000 Units by mouth daily.     diltiazem (CARDIZEM CD) 120 MG 24 hr capsule Take 120 mg by mouth daily.     ELIQUIS 2.5 MG TABS tablet Take 2.5 mg by mouth 2 (two) times daily.     ipratropium-albuterol (DUONEB) 0.5-2.5 (3) MG/3ML SOLN Take 3 mLs by nebulization every 6 (six) hours as needed. 360 mL 5   latanoprost (XALATAN) 0.005 % ophthalmic solution Place 1 drop into both eyes at bedtime.     metoprolol tartrate (LOPRESSOR) 100 MG tablet Take 100 mg by mouth 2 (two) times daily.      montelukast (SINGULAIR) 10 MG tablet Take 10 mg by mouth daily.     Probiotic Product (PROBIOTIC DAILY PO) Take 220 mg by mouth daily. 60 billions probiotic 10 strains     tiZANidine (ZANAFLEX) 2 MG tablet Take 2 mg by mouth every 6 (six) hours as needed for muscle spasms.     torsemide (DEMADEX) 20 MG tablet Take 20 mg by mouth every Monday, Wednesday, and Friday.     TRELEGY ELLIPTA 200-62.5-25 MCG/ACT AEPB INHALE 1 PUFF BY MOUTH INTO LUNGS DAILY 60 each 5   No current facility-administered medications on file prior to visit.    LABS/IMAGING: No results found for this or any previous visit (from the past 48 hours). US Venous Img Lower Bilateral (DVT) Result Date: 09/12/2023 CLINICAL DATA:  Bilateral lower extremity pain and edema. Evaluate for DVT. EXAM: BILATERAL LOWER EXTREMITY VENOUS DOPPLER ULTRASOUND TECHNIQUE: Gray-scale sonography with graded compression, as well as color Doppler and duplex ultrasound were performed to evaluate the lower extremity deep venous systems from the level of the common femoral vein and including the common femoral, femoral, profunda femoral, popliteal and calf veins including the posterior tibial, peroneal and gastrocnemius veins when visible. The superficial great saphenous vein was also interrogated. Spectral Doppler was utilized to evaluate flow at rest and with distal augmentation maneuvers in the common femoral, femoral and popliteal veins. COMPARISON:  None Available. FINDINGS: RIGHT LOWER EXTREMITY Common Femoral Vein: No evidence of thrombus. Normal compressibility, respiratory phasicity and response to augmentation. Saphenofemoral Junction: No evidence of thrombus. Normal compressibility and flow on color Doppler imaging. Profunda Femoral Vein: No evidence of thrombus. Normal compressibility and flow on color Doppler imaging. Femoral Vein: No evidence of thrombus. Normal compressibility, respiratory phasicity and response to augmentation. Popliteal Vein:  No evidence of thrombus. Normal compressibility, respiratory phasicity and response to augmentation. Calf Veins: No evidence of thrombus. Normal compressibility and flow on color Doppler imaging. Superficial Great Saphenous Vein: No evidence of thrombus. Normal compressibility. Other Findings: Note is made of a prominent varicosity at the medial aspect of the right calf (images 52 and 53). Pulsatile flow is seen throughout the interrogated portions of the right lower extremity venous system. LEFT LOWER EXTREMITY Common Femoral Vein: No evidence of thrombus. Normal compressibility, respiratory phasicity and response to augmentation. Saphenofemoral Junction: No evidence of thrombus. Normal compressibility and flow on color Doppler imaging. Profunda Femoral Vein: No evidence of thrombus. Normal compressibility and flow on color Doppler imaging. Femoral Vein: No evidence of thrombus. Normal compressibility, respiratory phasicity and response to augmentation. Popliteal Vein: No evidence of thrombus. Normal compressibility, respiratory phasicity and response to augmentation. Calf Veins: No evidence of thrombus. Normal compressibility  and flow on color Doppler imaging. Superficial Great Saphenous Vein: No evidence of thrombus. Normal compressibility. Other Findings: Note is made of a benign-appearing left inguinal lymph node. Note is made of a hypertrophied varicosity of the level of the posterior aspect of the distal left thigh (representative image 50). Pulsatile flow is seen throughout the interrogated portions of the left lower extremity venous system. IMPRESSION: 1. No evidence of DVT within either lower extremity. 2. Pulsatile venous flow demonstrated bilaterally, nonspecific though could be seen in the setting of right-sided heart failure. Clinical correlation is advised. Electronically Signed   By: Simonne Come M.D.   On: 09/12/2023 18:55    LIPID PANEL: No results found for: "CHOL", "TRIG", "HDL", "CHOLHDL",  "VLDL", "LDLCALC", "LDLDIRECT"   WEIGHTS: Wt Readings from Last 3 Encounters:  09/13/23 168 lb (76.2 kg)  07/01/23 177 lb 9.6 oz (80.6 kg)  04/29/23 176 lb 5.9 oz (80 kg)    VITALS: BP 94/65   Ht 5\' 7"  (1.702 m)   Wt 168 lb (76.2 kg)   SpO2 94%   BMI 26.31 kg/m   EXAM: General appearance: alert, no distress, and marked kyphosis Neck: no carotid bruit, no JVD, and thyroid not enlarged, symmetric, no tenderness/mass/nodules Lungs: diminished breath sounds bilaterally Heart: irregularly irregular rhythm Abdomen: soft, non-tender; bowel sounds normal; no masses,  no organomegaly Extremities: extremities normal, atraumatic, no cyanosis or edema and pain with palpation over the right lower posterior ribs, no ecchymosis Pulses: 2+ and symmetric Skin: Skin color, texture, turgor normal. No rashes or lesions Neurologic: Grossly normal Psych: Pleasant  EKG: N/A  ASSESSMENT: Probable permanent atrial fibrillation LVEF 50 to 55%, moderate RV dysfunction, severe biatrial enlargement, severe TR, mild AI (01/2023) Aortic atherosclerosis CKD 4  PLAN: 1.   Cory Morris seems to be doing well from a cardiac standpoint.  He is unaware of the A-fib.  He had some acute on chronic renal insufficiency recently may be related to an increase in his diuretic.  He has had some lower blood pressures but not clearly symptomatic with that.  Rate appears to be reasonably controlled on diltiazem and metoprolol with regards to A-fib.  There is a concern about whether he may be having some muscle aches or weakness related to his statin.  I advised a 2-week statin holiday to see if those symptoms improved.  If they do not he should resume his statin.  If they do we could consider perhaps lowering the dose based on repeat lab work which is already scheduled through his primary care provider.  Plan otherwise follow-up with me in 6 months or sooner as necessary.  Chrystie Nose, MD, [facility], FACP  [place]  [facility]  HeartCare  Medical Director of the Advanced Lipid Disorders &  Cardiovascular Risk Reduction Clinic Diplomate of the American Board of Clinical Lipidology Attending Cardiologist  Direct Dial: [phone]  Fax: [phone]  Website:  [email or web address] Nicely Rod Majerus 09/13/2023, 4:24 PM

## 2023-09-23 ENCOUNTER — Encounter: Payer: Self-pay | Admitting: Internal Medicine

## 2023-09-26 ENCOUNTER — Encounter: Payer: Self-pay | Admitting: Internal Medicine

## 2023-09-26 LAB — BASIC METABOLIC PANEL
BUN/Creatinine Ratio: 19 (ref 10–24)
BUN: 47 mg/dL — ABNORMAL HIGH (ref 8–27)
CO2: 24 mmol/L (ref 20–29)
Calcium: 8.8 mg/dL (ref 8.6–10.2)
Chloride: 99 mmol/L (ref 96–106)
Creatinine, Ser: 2.52 mg/dL — ABNORMAL HIGH (ref 0.76–1.27)
Glucose: 97 mg/dL (ref 70–99)
Potassium: 4.7 mmol/L (ref 3.5–5.2)
Sodium: 143 mmol/L (ref 134–144)
eGFR: 25 mL/min/{1.73_m2} — ABNORMAL LOW (ref >59)

## 2023-10-01 LAB — LAB REPORT - SCANNED: EGFR: 21

## 2023-10-21 ENCOUNTER — Other Ambulatory Visit: Payer: Self-pay | Admitting: Pulmonary Disease

## 2023-10-21 LAB — LAB REPORT - SCANNED: EGFR: 20

## 2023-10-23 ENCOUNTER — Ambulatory Visit: Admitting: Podiatry

## 2023-12-16 ENCOUNTER — Other Ambulatory Visit: Payer: Self-pay

## 2023-12-16 ENCOUNTER — Encounter (HOSPITAL_BASED_OUTPATIENT_CLINIC_OR_DEPARTMENT_OTHER): Payer: Self-pay

## 2023-12-16 DIAGNOSIS — I872 Venous insufficiency (chronic) (peripheral): Secondary | ICD-10-CM

## 2023-12-17 ENCOUNTER — Ambulatory Visit (INDEPENDENT_AMBULATORY_CARE_PROVIDER_SITE_OTHER): Admitting: Sports Medicine

## 2023-12-17 VITALS — BP 116/78 | HR 100 | Temp 97.2°F | Ht 67.0 in | Wt 166.6 lb

## 2023-12-17 DIAGNOSIS — C3432 Malignant neoplasm of lower lobe, left bronchus or lung: Secondary | ICD-10-CM

## 2023-12-17 DIAGNOSIS — J449 Chronic obstructive pulmonary disease, unspecified: Secondary | ICD-10-CM

## 2023-12-17 DIAGNOSIS — I4891 Unspecified atrial fibrillation: Secondary | ICD-10-CM

## 2023-12-17 DIAGNOSIS — N184 Chronic kidney disease, stage 4 (severe): Secondary | ICD-10-CM

## 2023-12-17 DIAGNOSIS — R2689 Other abnormalities of gait and mobility: Secondary | ICD-10-CM

## 2023-12-17 DIAGNOSIS — I5032 Chronic diastolic (congestive) heart failure: Secondary | ICD-10-CM | POA: Diagnosis not present

## 2023-12-17 NOTE — Progress Notes (Unsigned)
 Cardiology Clinic Note   Patient Name: Cory Morris Date of Encounter: 12/19/2023  Primary Care Provider:  Tye Gall, MD Primary Cardiologist:  Hazle Lites, MD  Patient Profile    Cory Morris 83 year old male presents to the clinic today for follow-up evaluation of his atrial fibrillation and chronic diastolic CHF.  Past Medical History    Past Medical History:  Diagnosis Date   Aortic atherosclerosis (HCC)    CKD (chronic kidney disease) stage 4, GFR 15-29 ml/min (HCC)    COPD (chronic obstructive pulmonary disease) (HCC)    Dysrhythmia    Family history of adverse reaction to anesthesia    his 2 daughters have N/V   Gout    Hypertension    Permanent atrial fibrillation (HCC)    Prediabetes    Past Surgical History:  Procedure Laterality Date   ATRIAL FIBRILLATION ABLATION      Allergies  Allergies  Allergen Reactions   Ace Inhibitors Other (See Comments)    Unknown   Amiodarone    Dronedarone Other (See Comments)    Unknown    History of Present Illness    Cory Morris has a PMH of atrial fibrillation, COPD, stage V CKD, gout, and aortic atherosclerosis.  He is from the [place] area.  He relocated to be closer to his daughter.  He was previously followed at the [facility] clinic by Dr. Mickael Alamo.  On initial evaluation Dr. Maximo Spar felt that his atrial fibrillation was permanent.  He did note that he had multiple episodes of cardioversion and ablation.  He denied obstructive coronary history.  He noted a history of falls.  He noted that when he would stand up quickly he would become dizzy and fall.  It was felt that he may possibly have vertigo.  He was using his walker and was noted to have marked kyphosis.  He was receiving home physical therapy.  He was seen in follow-up by Dr. Maximo Spar last on 09/13/2023.  During that time he was following up for lower extremity edema.  His blood pressure was noted to be 94/65.  He was continued on  torsemide  20 mg 3 times daily.  He had been seen by his nephrologist.  At that time his torsemide  had been increased to 20 mg daily.  He was noted to have significant edema with weeping of both lower extremities.  After his torsemide  was increased he had minimal improvement in his lower extremity swelling.  On evaluation his legs were wrapped.  There was clear that he continued to have drainage.  There was concern that possibly his diltiazem could be contributing to his symptoms.  He had been on diltiazem for several years.  It was felt that he would need higher dose of torsemide  for continued diuresis.  His torsemide  was increased to 20 mg twice daily.  Repeat BMP was scheduled for 1 week.  Follow-up was planned for 3 months.  He was instructed to continue to elevate his legs and use lower extremity wraps.  Follow-up BMP showed GFR of 25 and a creatinine of 2.52.  His torsemide  was continued 20 mg twice daily.  He presents to the clinic today for follow-up evaluation and states he continues to wear lower extremity wraps.  He has been working with vein and vascular.  His weight has remained stable.  He continues to monitor his blood pressure and pulse.  His pulse ranges from 70s to low 100s.  He is cardiac unaware.  He presents with  his daughter.  We reviewed his medication.  We reviewed the importance of maintaining fluid balance and avoiding salt.  They expressed understanding.  I will continue his current medication regimen and plan follow-up in 6 months..  Today he denies chest pain, shortness of breath, lower extremity edema, fatigue, palpitations, melena, hematuria, and hemoptysis.    Home Medications    Prior to Admission medications   Medication Sig Start Date End Date Taking? Authorizing Provider  albuterol  (VENTOLIN  HFA) 108 (90 Base) MCG/ACT inhaler INHALE TWO PUFFS BY MOUTH INTO LUNGS EVERY 6 HOURS AS NEEDED FOR WHEEZING/SHORTNESS OF BREATH 09/20/22   Mannam, Praveen, MD  allopurinol  (ZYLOPRIM) 100 MG tablet Take 100 mg by mouth.    [provider]  Biotin 1000 MCG CHEW Chew 1,000 mcg by mouth daily.    [provider]  cholecalciferol (VITAMIN D3) 25 MCG (1000 UNIT) tablet Take 1,000 Units by mouth daily.    [provider]  diltiazem (CARDIZEM CD) 120 MG 24 hr capsule Take 120 mg by mouth daily.    [provider]  ELIQUIS 2.5 MG TABS tablet Take 2.5 mg by mouth 2 (two) times daily. 09/24/21   [provider]  Fluticasone-Umeclidin-Vilant (TRELEGY ELLIPTA ) 200-62.5-25 MCG/ACT AEPB INHALE 1 PUFF INTO THE LUNGS DAILY 10/21/23   Antonio Baumgarten, NP  ipratropium-albuterol  (DUONEB) 0.5-2.5 (3) MG/3ML SOLN Take 3 mLs by nebulization every 6 (six) hours as needed. 12/21/22   Mannam, Praveen, MD  latanoprost (XALATAN) 0.005 % ophthalmic solution Place 1 drop into both eyes at bedtime.    [provider]  metoprolol  tartrate (LOPRESSOR ) 100 MG tablet Take 100 mg by mouth 2 (two) times daily.    [provider]  montelukast (SINGULAIR) 10 MG tablet Take 10 mg by mouth daily. 08/19/22   [provider]  Probiotic Product (PROBIOTIC DAILY PO) Take 220 mg by mouth daily. 60 billions probiotic 10 strains    [provider]  tiZANidine (ZANAFLEX) 2 MG tablet Take 2 mg by mouth every 6 (six) hours as needed for muscle spasms.    [provider]  torsemide  (DEMADEX ) 20 MG tablet Take 1 tablet (20 mg total) by mouth 2 (two) times daily. 09/13/23   Hazle Lites, MD    Family History    Family History  Problem Relation Age of Onset   Atrial fibrillation Neg Hx    Arrhythmia Neg Hx    He indicated that the status of his neg hx is unknown.  Social History    Social History   Socioeconomic History   Marital status: Married    Spouse name: Not on file   Number of children: Not on file   Years of education: Not on file   Highest education level: GED or equivalent  Occupational History   Not on  file  Tobacco Use   Smoking status: Former    Current packs/day: 1.00    Average packs/day: 1 pack/day for 60.0 years (60.0 ttl pk-yrs)    Types: Cigarettes   Smokeless tobacco: Never   Tobacco comments:    quit in April 2021  Vaping Use   Vaping status: Never Used  Substance and Sexual Activity   Alcohol use: Yes    Comment: occasional   Drug use: Never   Sexual activity: Not on file  Other Topics Concern   Not on file  Social History Narrative   Not on file   Social Drivers of Health   Financial Resource Strain:  Low Risk  (12/17/2023)   Overall Financial Resource Strain (CARDIA)    Difficulty of Paying Living Expenses: Not hard at all  Food Insecurity: No Food Insecurity (12/17/2023)   Hunger Vital Sign    Worried About Running Out of Food in the Last Year: Never true    Ran Out of Food in the Last Year: Never true  Transportation Needs: No Transportation Needs (12/17/2023)   PRAPARE - Administrator, Civil Service (Medical): No    Lack of Transportation (Non-Medical): No  Physical Activity: Unknown (12/17/2023)   Exercise Vital Sign    Days of Exercise per Week: 0 days    Minutes of Exercise per Session: Not on file  Stress: No Stress Concern Present (12/17/2023)   [employer] of Occupational Health - Occupational Stress Questionnaire    Feeling of Stress : Not at all  Social Connections: Socially Isolated (12/17/2023)   Social Connection and Isolation Panel [NHANES]    Frequency of Communication with Friends and Family: Never    Frequency of Social Gatherings with Friends and Family: Once a week    Attends Religious Services: Never    Database administrator or Organizations: No    Attends Engineer, structural: Not on file    Marital Status: Married  Catering manager Violence: Not At Risk (03/04/2023)   Humiliation, Afraid, Rape, and Kick questionnaire    Fear of Current or Ex-Partner: No    Emotionally Abused: No    Physically Abused: No     Sexually Abused: No     Review of Systems    General:  No chills, fever, night sweats or weight changes.  Cardiovascular:  No chest pain, dyspnea on exertion, edema, orthopnea, palpitations, paroxysmal nocturnal dyspnea. Dermatological: No rash, lesions/masses Respiratory: No cough, dyspnea Urologic: No hematuria, dysuria Abdominal:   No nausea, vomiting, diarrhea, bright red blood per rectum, melena, or hematemesis Neurologic:  No visual changes, wkns, changes in mental status. All other systems reviewed and are otherwise negative except as noted above.  Physical Exam    VS:  BP 100/74 (BP Location: Left Arm, Patient Position: Sitting, Cuff Size: Normal)   Pulse (!) 114   Ht 5\' 8"  (1.727 m)   Wt 167 lb (75.8 kg)   SpO2 93%   BMI 25.39 kg/m  , BMI Body mass index is 25.39 kg/m. GEN: Well nourished, well developed, in no acute distress. HEENT: normal. Neck: Supple, no JVD, carotid bruits, or masses. Cardiac: Irregular irregular, no murmurs, rubs, or gallops. No clubbing, cyanosis, edema.  Radials/DP/PT 2+ and equal bilaterally.  Respiratory:  Respirations regular and unlabored, clear to auscultation bilaterally. GI: Soft, nontender, nondistended, BS + x 4. MS: no deformity or atrophy. Skin: warm and dry, no rash. Neuro:  Strength and sensation are intact. Psych: Normal affect.  Accessory Clinical Findings    Recent Labs: 02/13/2023: ALT 8 04/29/2023: Hemoglobin 12.3; Platelets 218 09/25/2023: BUN 47; Creatinine, Ser 2.52; Potassium 4.7; Sodium 143   Recent Lipid Panel No results found for: "CHOL", "TRIG", "HDL", "CHOLHDL", "VLDL", "LDLCALC", "LDLDIRECT"       ECG personally reviewed by me today- EKG Interpretation Date/Time:  Thursday Dec 19 2023 15:53:50 EDT Ventricular Rate:  114 PR Interval:    QRS Duration:  132 QT Interval:  342 QTC Calculation: 471 R Axis:   47  Text Interpretation: Atrial fibrillation with rapid ventricular response with premature  ventricular or aberrantly conducted complexes Right bundle branch block When compared with ECG  of 01-Jul-2023 10:22, Right bundle branch block has replaced Non-specific intra-ventricular conduction block Confirmed by Lawana Pray [phone]) on 12/19/2023 4:04:02 PM     Echocardiogram 01/29/2023  IMPRESSIONS     1. Poor acoustic windows limit study. RV free wall difficult to see  apical window is foreshortened.   2. Left ventricular ejection fraction, by estimation, is 50 to 55%. The  left ventricle has low normal function.   3. RV free wall difficult to see well . Right ventricular systolic  function is moderately reduced. The right ventricular size is moderately  enlarged.   4. Left atrial size was moderately dilated.   5. Right atrial size was moderately dilated.   6. Mild mitral valve regurgitation.   7. Tricuspid valve regurgitation is severe.   8. The aortic valve is tricuspid. Aortic valve regurgitation is mild.  Aortic valve sclerosis/calcification is present, without any evidence of  aortic stenosis.   9. The inferior vena cava is dilated in size with <50% respiratory  variability, suggesting right atrial pressure of 15 mmHg.   FINDINGS   Left Ventricle: Left ventricular ejection fraction, by estimation, is 50  to 55%. The left ventricle has low normal function. Definity  contrast  agent was given IV to delineate the left ventricular endocardial borders.  The left ventricular internal cavity   size was normal in size. There is no left ventricular hypertrophy.   Right Ventricle: RV free wall difficult to see well. The right ventricular  size is moderately enlarged. Right vetricular wall thickness was not  assessed. Right ventricular systolic function is moderately reduced.   Left Atrium: Left atrial size was moderately dilated.   Right Atrium: Right atrial size was moderately dilated.   Pericardium: There is no evidence of pericardial effusion.   Mitral Valve: There is  mild thickening of the mitral valve leaflet(s).  Mild mitral annular calcification. Mild mitral valve regurgitation.   Tricuspid Valve: The tricuspid valve is normal in structure. Tricuspid  valve regurgitation is severe.   Aortic Valve: The aortic valve is tricuspid. Aortic valve regurgitation is  mild. Aortic regurgitation PHT measures 1045 msec. Aortic valve  sclerosis/calcification is present, without any evidence of aortic  stenosis. Aortic valve mean gradient measures  2.0 mmHg. Aortic valve peak gradient measures 2.7 mmHg. Aortic valve area,  by VTI measures 2.53 cm.   Pulmonic Valve: The pulmonic valve was grossly normal. Pulmonic valve  regurgitation is not visualized. No evidence of pulmonic stenosis.   Aorta: The aortic root and ascending aorta are structurally normal, with  no evidence of dilitation.   Venous: The inferior vena cava is dilated in size with less than 50%  respiratory variability, suggesting right atrial pressure of 15 mmHg.   IAS/Shunts: No atrial level shunt detected by color flow Doppler.    Assessment & Plan   1.  Chronic diastolic CHF-weight today 167 lbs.   Creatinine was stable on last evaluation.  Reviewed importance of continuing to follow low-sodium diet and monitor weights. Heart healthy low-sodium diet Fluid restriction 50-64 ounces Continue torsemide , metoprolol , diltiazem  Essential hypertension-BP today 100/74 Maintain blood pressure log Healthy low-sodium diet Continue diltiazem, metoprolol   Permanent atrial fibrillation-heart rate today 96.  Has been monitoring pulse at home with a daily log.  Pulse is ranged from 70s to low 100s.  Reports compliance with apixaban.  Denies bleeding issues.  He is cardiac unaware. Avoid triggers caffeine, chocolate, EtOH, dehydration etc. Continue  apixaban, Diltazem   CKD stage IV-creatinine 2.52 on  09/25/2023.  This is around his baseline creatinine. Following with nephrology.  Disposition:  Follow-up with Dr. Maximo Spar or me in 6 months.   Chet Cota. Glenyce Randle NP-C     12/19/2023, 4:26 PM [facility] [address] Office [phone] Fax [phone]    I spent 14 minutes examining this patient, reviewing medications, and using patient centered shared decision making involving their cardiac care.   I spent  20 minutes reviewing past medical history,  medications, and prior cardiac tests.

## 2023-12-17 NOTE — Progress Notes (Signed)
 Careteam: Patient Care Team: Tye Gall, MD as PCP - General (Internal Medicine) Hazle Lites, MD as PCP - Cardiology (Cardiology)  PLACE OF SERVICE:  [facility]  Advanced Directive information    Allergies  Allergen Reactions   Ace Inhibitors Other (See Comments)    Unknown   Dronedarone Other (See Comments)    Unknown    Chief Complaint  Patient presents with   New Patient (Initial Visit)    Est care, rash on left leg, diaherra going for 2 -3 weeks , no stomach pain ,having nausea if eat before taking medication , swelling and weaping in legs going 2019 has gotten worse , increased fluid pills he as an referral vein  vauslar      Discussed the use of AI scribe software for clinical note transcription with the patient, who gave verbal consent to proceed.  History of Present Illness    Cory Morris is an 83 year old male with heart failure and chronic kidney disease who presents to establish care   He is accompanied by his daughter, Tanis Fan, who assists with his care. Pt lives with his wife in a Independent living at a Nursing facility. Daughter helps with medication management.  He has experienced chronic leg swelling for the past six to eight years . Daughter states that redness and swelling is about the same and changed since January. It weeps intermittently and she does dressing changes.Pt denies fevers, chills, pain with palpation.  He experiences fatigue and pain in his legs, limiting his ability to walk. The pain is located on the sides of both legs. He uses a walker for mobility and reports balance issues, though he has not had any falls in the past six months.  He has a history of COPD but his breathing is not significantly affected at present. No chest pain or shortness of breath with exertion. He uses an albuterol  inhaler infrequently, with the last use being two weeks ago.c/o intermittent cough but no change recently, brings clear phlegm reports no  change in the consistency of the phlegm.  He has a history of lung issues, having undergone radiation treatment for lung nodules, and follows up annually with a specialist for this condition.  He experiences occasional diarrhea with intermittent constipation followed by liquid stools. He passes 1 stool per day.  Review of Systems:  Review of Systems  Constitutional:  Negative for fever.  HENT:  Negative for congestion and sore throat.   Eyes:  Negative for double vision.  Respiratory:  Positive for cough and shortness of breath (exertional). Negative for sputum production.   Cardiovascular:  Negative for chest pain, palpitations and leg swelling.  Gastrointestinal:  Negative for abdominal pain, heartburn and nausea.  Genitourinary:  Negative for dysuria and hematuria.  Neurological:  Negative for dizziness.   Negative unless indicated in HPI.   Past Medical History:  Diagnosis Date   Aortic atherosclerosis (HCC)    CKD (chronic kidney disease) stage 4, GFR 15-29 ml/min (HCC)    COPD (chronic obstructive pulmonary disease) (HCC)    Dysrhythmia    Family history of adverse reaction to anesthesia    his 2 daughters have N/V   Gout    Hypertension    Permanent atrial fibrillation (HCC)    Prediabetes    Past Surgical History:  Procedure Laterality Date   ATRIAL FIBRILLATION ABLATION     Social History:   reports that he has quit smoking. His smoking use included cigarettes. He has  a 60 pack-year smoking history. He has never used smokeless tobacco. He reports current alcohol use. He reports that he does not use drugs.  Family History  Problem Relation Age of Onset   Atrial fibrillation Neg Hx    Arrhythmia Neg Hx     Medications: Patient's Medications  New Prescriptions   No medications on file  Previous Medications   ALBUTEROL  (VENTOLIN  HFA) 108 (90 BASE) MCG/ACT INHALER    INHALE TWO PUFFS BY MOUTH INTO LUNGS EVERY 6 HOURS AS NEEDED FOR WHEEZING/SHORTNESS OF BREATH    ALLOPURINOL (ZYLOPRIM) 100 MG TABLET    Take 100 mg by mouth.   BIOTIN 1000 MCG CHEW    Chew 1,000 mcg by mouth daily.   CHOLECALCIFEROL (VITAMIN D3) 25 MCG (1000 UNIT) TABLET    Take 1,000 Units by mouth daily.   DILTIAZEM (CARDIZEM CD) 120 MG 24 HR CAPSULE    Take 120 mg by mouth daily.   ELIQUIS 2.5 MG TABS TABLET    Take 2.5 mg by mouth 2 (two) times daily.   FLUTICASONE-UMECLIDIN-VILANT (TRELEGY ELLIPTA ) 200-62.5-25 MCG/ACT AEPB    INHALE 1 PUFF INTO THE LUNGS DAILY   IPRATROPIUM-ALBUTEROL  (DUONEB) 0.5-2.5 (3) MG/3ML SOLN    Take 3 mLs by nebulization every 6 (six) hours as needed.   LATANOPROST (XALATAN) 0.005 % OPHTHALMIC SOLUTION    Place 1 drop into both eyes at bedtime.   METOPROLOL  TARTRATE (LOPRESSOR ) 100 MG TABLET    Take 100 mg by mouth 2 (two) times daily.   MONTELUKAST (SINGULAIR) 10 MG TABLET    Take 10 mg by mouth daily.   PROBIOTIC PRODUCT (PROBIOTIC DAILY PO)    Take 220 mg by mouth daily. 60 billions probiotic 10 strains   TIZANIDINE (ZANAFLEX) 2 MG TABLET    Take 2 mg by mouth every 6 (six) hours as needed for muscle spasms.   TORSEMIDE  (DEMADEX ) 20 MG TABLET    Take 1 tablet (20 mg total) by mouth 2 (two) times daily.  Modified Medications   No medications on file  Discontinued Medications   No medications on file    Physical Exam: Vitals:   12/17/23 1309  BP: 116/78  Pulse: 100  Temp: (!) 97.2 F (36.2 C)  TempSrc: Temporal  SpO2: 99%  Weight: 166 lb 9.6 oz (75.6 kg)  Height: 5\' 7"  (1.702 m)   Body mass index is 26.09 kg/m. BP Readings from Last 3 Encounters:  12/17/23 116/78  09/13/23 94/65  07/01/23 108/78   Wt Readings from Last 3 Encounters:  12/17/23 166 lb 9.6 oz (75.6 kg)  09/13/23 168 lb (76.2 kg)  07/01/23 177 lb 9.6 oz (80.6 kg)    Physical Exam Constitutional:      Appearance: Normal appearance.  HENT:     Head: Normocephalic and atraumatic.  Cardiovascular:     Rate and Rhythm: Normal rate. Rhythm irregular.     Pulses: Normal  pulses.     Heart sounds: Normal heart sounds.  Pulmonary:     Effort: No respiratory distress.     Breath sounds: No stridor. Rales (minimal rales left lung base) present. No wheezing.  Abdominal:     General: Bowel sounds are normal. There is no distension.     Palpations: Abdomen is soft.     Tenderness: There is no abdominal tenderness. There is no guarding.  Musculoskeletal:        General: Swelling present.     Comments: Chronic  Erythema with weeping from his legs  No warmth  No tenderness  Neurological:     Mental Status: He is alert. Mental status is at baseline.     Motor: No weakness.     Labs reviewed: Basic Metabolic Panel: Recent Labs    02/13/23 2058 04/29/23 1728 09/04/23 0000 09/25/23 0835  NA 136 134*  --  143  K 4.2 3.6  --  4.7  CL 98 94*  --  99  CO2 27 25  --  24  GLUCOSE 189* 107*  --  97  BUN 46* 70*  --  47*  CREATININE 2.50* 3.18*  --  2.52*  CALCIUM 9.6 8.0* 8.7 8.8   Liver Function Tests: Recent Labs    02/13/23 2058  AST 11*  ALT 8  ALKPHOS 91  BILITOT 1.5*  PROT 7.4  ALBUMIN 4.2   No results for input(s): "LIPASE", "AMYLASE" in the last 8760 hours. No results for input(s): "AMMONIA" in the last 8760 hours. CBC: Recent Labs    02/13/23 2058 04/29/23 1728  WBC 15.0* 11.6*  NEUTROABS 12.3*  --   HGB 12.6* 12.3*  HCT 38.6* 38.5*  MCV 91.3 92.5  PLT 174 218   Lipid Panel: No results for input(s): "CHOL", "HDL", "LDLCALC", "TRIG", "CHOLHDL", "LDLDIRECT" in the last 8760 hours. TSH: No results for input(s): "TSH" in the last 8760 hours. A1C: No results found for: "HGBA1C"  Assessment and Plan Assessment & Plan  1. 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 Chronic heart failure with preserved ejection fraction (HCC)  Lungs clear Chronic lower extremity swelling Daughter reports no recent change in swelling, redness Pt denies tenderness on palpating his  legs No fevers No warmth on palpation Cont with demadex  Avoid salty foods Follow up with cardiology  3. Chronic obstructive pulmonary disease, unspecified COPD type (HCC)  No wheezing Cont with symbicort  4. Atrial fibrillation, unspecified type (HCC)  No signs of bleeding Cont with metoprolol , cardizem, eleiquis  5. Balance disorder   - Ambulatory referral to Physical Therapy  6. Stage 4 chronic kidney disease (HCC)  Avoid nephrotoxic meds Monitor creatinine Need records from nephrology      60 min Total time spent for obtaining history,  performing a medically appropriate examination and evaluation, reviewing the tests, documenting clinical information in the electronic or other health record,  care coordination (not separately reported)

## 2023-12-19 ENCOUNTER — Encounter: Payer: Self-pay | Admitting: General Practice

## 2023-12-19 ENCOUNTER — Ambulatory Visit: Payer: Medicare Other | Attending: General Practice | Admitting: General Practice

## 2023-12-19 VITALS — BP 100/74 | HR 114 | Ht 68.0 in | Wt 167.0 lb

## 2023-12-19 DIAGNOSIS — I482 Chronic atrial fibrillation, unspecified: Secondary | ICD-10-CM | POA: Diagnosis present

## 2023-12-19 DIAGNOSIS — I10 Essential (primary) hypertension: Secondary | ICD-10-CM | POA: Diagnosis present

## 2023-12-19 DIAGNOSIS — N184 Chronic kidney disease, stage 4 (severe): Secondary | ICD-10-CM | POA: Diagnosis present

## 2023-12-19 DIAGNOSIS — I509 Heart failure, unspecified: Secondary | ICD-10-CM

## 2023-12-19 NOTE — Patient Instructions (Addendum)
 Medication Instructions:  No medication changes were made during today's visit.  *If you need a refill on your cardiac medications before your next appointment, please call your pharmacy*   Lab Work: No medication changes were made during today's visit.  If you have labs (blood work) drawn today and your tests are completely normal, you will receive your results only by: MyChart Message (if you have MyChart) OR A paper copy in the mail If you have any lab test that is abnormal or we need to change your treatment, we will call you to review the results.   Testing/Procedures: No procedures were ordered during today's visit.    Follow-Up: At [facility], you and your health needs are our priority.  As part of our continuing mission to  provide you with exceptional heart care, we have created designated Provider Care Teams.  These Care Teams include your primary Cardiologist (physician) and Advanced Practice Providers (APPs -  Physician Assistants and Nurse Practitioners) who all work together to provide you with the care you need, when you need it.  We recommend signing up for the patient portal called "MyChart".  Sign up information is provided on this After Visit Summary.  MyChart is used to connect with patients for Virtual Visits (Telemedicine).  Patients are able to view lab/test results, encounter notes, upcoming appointments, etc.  Non-urgent messages can be sent to your provider as well.   To learn more about what you can do with MyChart, go to [email or web address].    Your next appointment:   6 month(s)  Provider:   Lawana Pray, NP          Other Instructions  [facility]:

## 2023-12-25 ENCOUNTER — Ambulatory Visit (INDEPENDENT_AMBULATORY_CARE_PROVIDER_SITE_OTHER): Admitting: Physician Assistant

## 2023-12-25 ENCOUNTER — Other Ambulatory Visit: Payer: Self-pay

## 2023-12-25 ENCOUNTER — Ambulatory Visit (HOSPITAL_COMMUNITY)
Admission: RE | Admit: 2023-12-25 | Discharge: 2023-12-25 | Disposition: A | Discharge status: Home / Self Care | Source: Ambulatory Visit | Attending: Vascular Surgery | Admitting: Vascular Surgery

## 2023-12-25 VITALS — BP 125/87 | HR 94 | Ht 68.0 in | Wt 167.0 lb

## 2023-12-25 DIAGNOSIS — I872 Venous insufficiency (chronic) (peripheral): Secondary | ICD-10-CM

## 2023-12-25 NOTE — Progress Notes (Addendum)
 Office Note     CC:  follow up Requesting Provider:  Cristi Donalds, MD  HPI: Cory Morris is a 83 y.o. (1940-08-28) male who presents for evaluation of bilateral lower extremity edema.  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  He lives with his wife at [facility] assisted living facility.  He is here today with his daughter who lives locally and is able to help care for him and his wife on a daily basis.  He denies history of DVT, significant trauma of bilateral lower extremities, and prior vascular procedures.  He moved from [place]  about 5 years ago.  He received wound care with Unna boots at the [facility] due to venous ulcerations.  Based on history from his daughter he has limited mobility which has been worsening over the past several months.  He is a former smoker.  Patient states he is able to elevate his legs without shortness of breath.  He is on Eliquis for permanent atrial fibrillation.  He is a prediabetic.   Past Medical History:  Diagnosis Date   Aortic atherosclerosis (HCC)    CKD (chronic kidney disease) stage 4, GFR 15-29 ml/min (HCC)    COPD (chronic obstructive pulmonary disease) (HCC)    Dysrhythmia    Family history of adverse reaction to anesthesia    his 2 daughters have N/V   Gout    Hypertension    Permanent atrial fibrillation (HCC)    Prediabetes     Past Surgical History:  Procedure Laterality Date   ATRIAL FIBRILLATION ABLATION      Social History   Socioeconomic History   Marital status: Married    Spouse name: Not on file   Number of children: Not on file   Years of education: Not on file   Highest education level: GED or equivalent  Occupational History   Not on file  Tobacco Use   Smoking status: Former    Current packs/day: 1.00    Average packs/day: 1 pack/day for 60.0 years (60.0 ttl pk-yrs)    Types: Cigarettes   Smokeless tobacco: Never   Tobacco comments:    quit in  April 2021  Vaping Use   Vaping status: Never Used  Substance and Sexual Activity   Alcohol use: Yes    Comment: occasional   Drug use: Never   Sexual activity: Not on file  Other Topics Concern   Not on file  Social History Narrative   Not on file   Social Drivers of Health   Financial Resource Strain: Low Risk  (12/17/2023)   Overall Financial Resource Strain (CARDIA)    Difficulty of Paying Living Expenses: Not hard at all  Food Insecurity: No Food Insecurity (12/17/2023)   Hunger Vital Sign    Worried About Running Out of Food in the Last Year: Never true    Ran Out of Food in the Last Year: Never true  Transportation Needs: No Transportation Needs (12/17/2023)   PRAPARE - Administrator, Civil Service (Medical): No    Lack of Transportation (Non-Medical): No  Physical Activity: Unknown (12/17/2023)   Exercise Vital Sign    Days of Exercise per Week: 0 days    Minutes of Exercise per Session: Not on file  Stress: No Stress Concern Present (12/17/2023)   [employer] of Occupational Health - Occupational Stress Questionnaire    Feeling of Stress : Not at all  Social Connections: Socially Isolated (12/17/2023)  Social Advertising account executive [NHANES]    Frequency of Communication with Friends and Family: Never    Frequency of Social Gatherings with Friends and Family: Once a week    Attends Religious Services: Never    Database administrator or Organizations: No    Attends Engineer, structural: Not on file    Marital Status: Married  Catering manager Violence: Not At Risk (03/04/2023)   Humiliation, Afraid, Rape, and Kick questionnaire    Fear of Current or Ex-Partner: No    Emotionally Abused: No    Physically Abused: No    Sexually Abused: No    Family History  Problem Relation Age of Onset   Atrial fibrillation Neg Hx    Arrhythmia Neg Hx     Current Outpatient Medications  Medication Sig Dispense Refill   albuterol  (VENTOLIN  HFA) 108  (90 Base) MCG/ACT inhaler INHALE TWO PUFFS BY MOUTH INTO LUNGS EVERY 6 HOURS AS NEEDED FOR WHEEZING/SHORTNESS OF BREATH 18 g 5   allopurinol (ZYLOPRIM) 100 MG tablet Take 100 mg by mouth.     Biotin 1000 MCG CHEW Chew 1,000 mcg by mouth daily.     cholecalciferol (VITAMIN D3) 25 MCG (1000 UNIT) tablet Take 1,000 Units by mouth daily.     diltiazem (CARDIZEM CD) 120 MG 24 hr capsule Take 120 mg by mouth daily.     ELIQUIS 2.5 MG TABS tablet Take 2.5 mg by mouth 2 (two) times daily.     Fluticasone-Umeclidin-Vilant (TRELEGY ELLIPTA ) 200-62.5-25 MCG/ACT AEPB INHALE 1 PUFF INTO THE LUNGS DAILY 180 each 5   ipratropium-albuterol  (DUONEB) 0.5-2.5 (3) MG/3ML SOLN Take 3 mLs by nebulization every 6 (six) hours as needed. 360 mL 5   latanoprost (XALATAN) 0.005 % ophthalmic solution Place 1 drop into both eyes at bedtime.     metoprolol  tartrate (LOPRESSOR ) 100 MG tablet Take 100 mg by mouth 2 (two) times daily.     montelukast (SINGULAIR) 10 MG tablet Take 10 mg by mouth daily.     Probiotic Product (PROBIOTIC DAILY PO) Take 220 mg by mouth daily. 60 billions probiotic 10 strains     torsemide  (DEMADEX ) 20 MG tablet Take 1 tablet (20 mg total) by mouth 2 (two) times daily. 60 tablet 5   No current facility-administered medications for this visit.    Allergies  Allergen Reactions   Ace Inhibitors Other (See Comments)    Unknown   Dronedarone Other (See Comments)    Unknown     REVIEW OF SYSTEMS:   [X]  denotes positive finding, [ ]  denotes negative finding Cardiac  Comments:  Chest pain or chest pressure:    Shortness of breath upon exertion:    Short of breath when lying flat:    Irregular heart rhythm:        Vascular    Pain in calf, thigh, or hip brought on by ambulation:    Pain in feet at night that wakes you up from your sleep:     Blood clot in your veins:    Leg swelling:         Pulmonary    Oxygen at home:    Productive cough:     Wheezing:         Neurologic    Sudden  weakness in arms or legs:     Sudden numbness in arms or legs:     Sudden onset of difficulty speaking or slurred speech:    Temporary loss of vision in  one eye:     Problems with dizziness:         Gastrointestinal    Blood in stool:     Vomited blood:         Genitourinary    Burning when urinating:     Blood in urine:        Psychiatric    Major depression:         Hematologic    Bleeding problems:    Problems with blood clotting too easily:        Skin    Rashes or ulcers:        Constitutional    Fever or chills:      PHYSICAL EXAMINATION:  Vitals:   12/25/23 1219  BP: 125/87  Pulse: 94  TempSrc: Temporal  Weight: 167 lb (75.8 kg)  Height: 5\' 8"  (1.727 m)    General:  WDWN in NAD; vital signs documented above Gait: Not observed HENT: WNL, normocephalic Pulmonary: normal non-labored breathing , without Rales, rhonchi,  wheezing Cardiac: regular HR Abdomen: soft, NT, no masses Skin: without rashes Vascular Exam/Pulses: brisk AT PT doppler signals Extremities: Hyperpigmentation and induration of bilateral lower legs with weeping clear fluid Musculoskeletal: no muscle wasting or atrophy  Neurologic: A&O X 3 Psychiatric:  The pt has Normal affect.   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 male here for evaluation of bilateral lower extremity edema  Mr. Huhta is an 83 year old male with bilateral lower extremity edema.  He has skin changes related to venous insufficiency and also has weeping clear fluid from bilateral lower legs.  Unfortunately he is also being treated for CHF as well as CKD stage IV.  Venous reflux study is negative for DVT.  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  We discussed proper leg elevation periodically during the day.  I also encouraged increasing mobility.  He will be referred to [facility] wound clinic for potential Unna boot placement and regular changes.  We will also try to arrange home health RN for Unna boot changes until he is able to be established at the wound clinic.  In the meantime patient's daughter was educated on wrapping the legs with Ace wraps.  If in the future he increases his mobility and improves from a nephrology and cardiology standpoint we could consider right GSV ablation therapy.  For now he will follow-up on an as-needed basis.   Cory Deters, PA-C [facility] [phone]  Clinic MD:   Vikki Graves

## 2023-12-26 ENCOUNTER — Encounter: Payer: Self-pay | Admitting: Pulmonary Disease

## 2023-12-26 ENCOUNTER — Ambulatory Visit: Admitting: Pulmonary Disease

## 2023-12-26 VITALS — BP 142/82 | HR 90 | Ht 68.0 in | Wt 176.0 lb

## 2023-12-26 DIAGNOSIS — R911 Solitary pulmonary nodule: Secondary | ICD-10-CM | POA: Diagnosis not present

## 2023-12-26 DIAGNOSIS — J849 Interstitial pulmonary disease, unspecified: Secondary | ICD-10-CM

## 2023-12-26 DIAGNOSIS — M109 Gout, unspecified: Secondary | ICD-10-CM

## 2023-12-26 DIAGNOSIS — J449 Chronic obstructive pulmonary disease, unspecified: Secondary | ICD-10-CM

## 2023-12-26 DIAGNOSIS — N184 Chronic kidney disease, stage 4 (severe): Secondary | ICD-10-CM

## 2023-12-26 DIAGNOSIS — J45909 Unspecified asthma, uncomplicated: Secondary | ICD-10-CM

## 2023-12-26 MED ORDER — PREDNISONE 10 MG PO TABS: 40.0000 mg | ORAL_TABLET | Freq: Every day | ORAL | 0 refills | Status: AC

## 2023-12-26 NOTE — Progress Notes (Signed)
 Cory Morris    [id number]    [birth year]  Primary Care Physician:Veludandi, Selina Dale, MD  Referring Physician: Augustus Blood, DO [address] [place],  [place] [address]  Chief complaint:   Follow up for COPD  HPI: 83 year old with history of chronic kidney disease stage III, hypertension, COPD, hyperlipidemia, gout, atrial fibrillation, HFpEF.  Referred for evaluation of COPD.  He is moved here recently from [place] to be closer to family.  Previously followed by Dr. Melene Sportsman at [facility].  Maintained on anoro which has not made any difference with his breathing. He is relatively sedentary and denies any dyspnea, cough, mucus production, wheezing.   Off anoro since December 2021 per patient preference due to cost of medication  Pets: No pets Occupation: Retired Arboriculturist Exposures: No exposure to asbestos.  No mold, hot tub, Jacuzzi.  No feather pillows or comforters Smoking history: 60-pack-year smoker.  Quit in April 2021 Travel history: From [place] .  No significant recent travel Relevant family history: No significant family history of lung disease  Interim history: Discussed the use of AI scribe software for clinical note transcription with the patient, who gave verbal consent to proceed.  History of Present Illness Cory Morris is an 83 year old male with lung cancer who presents for a follow-up visit.  He has a history of a right lung nodule initially suspected to be lung cancer. A bronchoscopy was planned in July 2024 but canceled due to fluid in the lungs and CT scan showing multilobar pneumonia, concern for respiratory status.Aaron Aas Despite the lack of a tissue diagnosis, he underwent six sessions of SBRT radiation treatment. The last CT scan was conducted in July 2023, prior to the radiation therapy, and a follow-up CT scan has not yet been performed.  He has a history of COPD and asthma, managed with daily  Trelegy and an emergency inhaler, albuterol , which he has not needed to use frequently. He quit smoking in 2021.  He also has chronic kidney disease, fluctuating between stage three and stage four, and is under the care of Dr. Zelda Hickman at [facility]. He is not on dialysis. He recently visited a vascular center where venous reflux was assessed, and no blood clots were found. He was referred to a wound clinic for his legs, although he does not have open sores.  He experiences gout in his right knee, which began today, accompanied by significant swelling in his legs. He takes allopurinol daily for gout management. His legs are wrapped due to swelling, and he describes the knee as 'very sensitive.'  He has a history of congestive heart failure and edema in his legs, which are ongoing issues. He recently saw Dr. Maximo Spar for a heart evaluation, which was reported as good.    Outpatient Encounter Medications as of 12/26/2023  Medication Sig   albuterol  (VENTOLIN  HFA) 108 (90 Base) MCG/ACT inhaler INHALE TWO PUFFS BY MOUTH INTO LUNGS EVERY 6 HOURS AS NEEDED FOR WHEEZING/SHORTNESS OF BREATH   allopurinol (ZYLOPRIM) 100 MG tablet Take 100 mg by mouth.   Biotin 1000 MCG CHEW Chew 1,000 mcg by mouth daily.   cholecalciferol (VITAMIN D3) 25 MCG (1000 UNIT) tablet Take 1,000 Units by mouth daily.   diltiazem (CARDIZEM CD) 120 MG 24 hr capsule Take 120 mg by mouth daily.   ELIQUIS 2.5 MG TABS tablet Take 2.5 mg by mouth 2 (two) times daily.   Fluticasone-Umeclidin-Vilant (TRELEGY ELLIPTA ) 200-62.5-25 MCG/ACT AEPB INHALE 1  PUFF INTO THE LUNGS DAILY   ipratropium-albuterol  (DUONEB) 0.5-2.5 (3) MG/3ML SOLN Take 3 mLs by nebulization every 6 (six) hours as needed.   latanoprost (XALATAN) 0.005 % ophthalmic solution Place 1 drop into both eyes at bedtime.   metoprolol  tartrate (LOPRESSOR ) 100 MG tablet Take 100 mg by mouth 2 (two) times daily.   montelukast (SINGULAIR) 10 MG tablet Take 10 mg by mouth daily.    Probiotic Product (PROBIOTIC DAILY PO) Take 220 mg by mouth daily. 60 billions probiotic 10 strains   torsemide  (DEMADEX ) 20 MG tablet Take 1 tablet (20 mg total) by mouth 2 (two) times daily.   No facility-administered encounter medications on file as of 12/26/2023.    Physical Exam: Blood pressure (!) 142/82, pulse 90, height 5\' 8"  (1.727 m), weight 176 lb (79.8 kg), SpO2 93%. Gen:      No acute distress HEENT:  EOMI, sclera anicteric Neck:     No masses; no thyromegaly Lungs:    Clear to auscultation bilaterally; normal respiratory effort CV:         Regular rate and rhythm; no murmurs Abd:      + bowel sounds; soft, non-tender; no palpable masses, no distension Ext:    Right knee swelling, tenderness Skin:      Warm and dry; no rash Neuro: alert and oriented x 3 Psych: normal mood and affect   Data Reviewed: Imaging: Chest x-ray 07/22/2020-small bilateral effusions, basal atelectasis, hyperinflation Chest x-ray 09/13/2021-chronic interstitial changes CT high-resolution 01/23/2022-subtle changes of patchy groundglass attenuation and mild septal thickening. CT high-resolution 07/25/2022-interval enlargement of nodular opacity in the left lower lobe PET scan 11/27/2022- hypermetabolic left lower lobe pulmonary nodule' CT chest 02/24/2023-increase in size of left lower lobe pulmonary nodule [wrongly identified as right lower lobe in radiology report], right lung attenuation, consolidation which is new I have reviewed the images personally.  PFTs: 10/20/2020 FVC 2.42 [71%], FEV1 1.19 [50%], F/F 49, TLC 6.27 [100%], DLCO 9.67 [45%] Severe obstruction, diffusion defect with air trapping and bronchodilator response  Labs: Labs from primary care CBC 09/23/2020-WBC 7.58, eos 4.2% absolute eosinophil count 320 CBC 06/16/2020- WBC 8.67, eos 6%, absolute eosinophil count 520 CMP 09/23/2020 significant for alk phos 160, BUN 55, creatinine 2.19  CBC 09/25/2021-WBC 10.8, eos 2.4%, absolute  eosinophil count 259 IgE 09/13/2021-275 Alpha-1 antitrypsin 09/13/2021-240, PI MM  ILD serology 01/31/2022-significant only for rheumatoid factor 14  Assessment & Plan Lung cancer Presumed lung cancer with enlarging PET positive left lower lobe lung nodule.  Underwent six SBRT radiation treatments in 2024 without tissue diagnosis due to cancellation of bronchoscopy. No CT scan has been done since before radiation. - Order CT scan to evaluate lung nodule and assess post-radiation status.  Interstitial lung disease, mild Mild interstitial lung disease noted on previous CT scans. CT scan needed to reassess current status. - Order CT scan to reassess interstitial lung disease.  Chronic obstructive pulmonary disease (COPD) COPD managed with Trelegy inhaler. No recent exacerbations reported. Albuterol  emergency inhaler used infrequently. - Continue Trelegy inhaler. - Continue albuterol  as needed.  Asthma Asthma managed with Singulair and Trelegy. No recent exacerbations reported. - Continue Singulair. - Continue Trelegy inhaler.  Chronic kidney disease, stage 4 CKD stage 4, managed by Dr. Zelda Hickman at [facility]. No dialysis required.  Congestive heart failure, leg edema CHF with associated edema in legs. Recent evaluation by vascular center showed no blood clots. Follows with Dr. Maximo Spar  Gout Acute gout flare in right knee, associated with swelling. Managed  with allopurinol. Request for prednisone  to manage acute symptoms. - Prescribe prednisone  40 mg daily for 5 days. - Continue allopurinol.    Plan/Recommendations: Follow-up CT Continue Trelegy, albuterol , Singulair Prednisone  40 mg/day for 5 days to treat gout flare  Phyllis Breeze MD [facility] none 12/26/2023, 11:34 AM  CC: Combs, Jannett Mems, *

## 2023-12-26 NOTE — Patient Instructions (Signed)
 VISIT SUMMARY:  Today, you came in for a follow-up visit to discuss your ongoing health issues, including lung cancer, COPD, asthma, chronic kidney disease, congestive heart failure, leg edema, and gout. We reviewed your current treatments and made some adjustments to better manage your conditions.  YOUR PLAN:  -LUNG CANCER: Lung cancer is a type of cancer that begins in the lungs. You have a nodule on the right side and have undergone six SPRT radiation treatments. We need to order a CT scan to evaluate the lung nodule and assess the status after radiation.  -INTERSTITIAL LUNG DISEASE, MILD: Interstitial lung disease involves scarring of the lung tissue. We need to order a CT scan to reassess your current status.  -CHRONIC OBSTRUCTIVE PULMONARY DISEASE (COPD): COPD is a chronic inflammatory lung disease that obstructs airflow from the lungs. You are managing it with Trelegy and an emergency inhaler, albuterol . Continue using Trelegy daily and albuterol  as needed.  -ASTHMA: Asthma is a condition in which your airways narrow and swell, producing extra mucus. You are managing it with Singulair and Trelegy. Continue using both medications as prescribed.  -CHRONIC KIDNEY DISEASE, STAGE 4: Chronic kidney disease means your kidneys are damaged and can't filter blood the way they should. You are currently managed by Dr. Zelda Hickman at [facility] and do not require dialysis at this time.  -CONGESTIVE HEART FAILURE: Congestive heart failure means your heart doesn't pump blood as well as it should. You have associated leg edema, but recent evaluations showed no blood clots.  -EDEMA IN LEGS: Edema is swelling caused by excess fluid trapped in your body's tissues. Your legs were evaluated, and no blood clots were found. You have been referred to a wound clinic even though you do not have open sores.  -GOUT: Gout is a form of arthritis characterized by severe pain, redness, and tenderness in joints. You have  an acute flare in your right knee. We will prescribe prednisone  40 mg daily for 5 days to manage the acute symptoms and continue your daily allopurinol.  INSTRUCTIONS:  Please schedule a CT scan to evaluate your lung nodule and reassess your interstitial lung disease. Continue taking your medications as prescribed. Follow up with Dr. Zelda Hickman for your chronic kidney disease management and visit the wound clinic as referred. If you experience any new or worsening symptoms, please contact our office.

## 2023-12-31 ENCOUNTER — Telehealth: Payer: Self-pay

## 2023-12-31 ENCOUNTER — Encounter: Payer: Self-pay | Admitting: Sports Medicine

## 2023-12-31 NOTE — Telephone Encounter (Signed)
 Katie, [facility] RN, called asking about patient's treatment for the venous swelling in his bilateral lower legs. The order from the PA was to place Unna boots but after her assessment she didn't think placing the boots would be wise.  The pt's daughter had wrapped the pt's lower legs in ACE wraps  but the swelling moved from the lower legs to the upper thighs.  Katie reported that his upper thighs were tight and she didn't think placement of the Unna boots would be a good idea.  Katie reported that the lower legs were beginning to weep but that he had no open ulcers.  She also stated that the pt's diuretic was increased by the nephrologist but that the pt is still gaining weight.  She plans to consult the nephrologist.  Jolan Natal, [place] was consulted.  He stated that wrapping the legs from the toes to the thighs with ACE wraps would be fine for now but if the weeping increases, the nurse would need to put on the Unna boots up to just below the knees and that she could wrap the thighs in ACE wraps if she deemed it necessary.  He also encouraged her to make sure the pt is elevating his legs above his heart and increasing his mobility.  Alston Jerry stated she would have the daughter assess her dad's legs for increased weeping, swelling, open ulcers and scrotal swelling when the legs are wrapped up to the thigh.  She would have the daughter remove the thigh wraps if scrotal swelling is evident.    During her next visit with the patient, Alston Jerry will assess the need for Unna boots and will use her nursing judgement to implement the correct treatment.  Alston Jerry will also call [facility] for further recommendations should she need them.

## 2024-01-06 ENCOUNTER — Encounter: Payer: Self-pay | Admitting: Sports Medicine

## 2024-01-07 MED ORDER — ALLOPURINOL 100 MG PO TABS: 100.0000 mg | ORAL_TABLET | Freq: Every day | ORAL | 0 refills | Status: DC

## 2024-01-07 NOTE — Telephone Encounter (Signed)
 Patient daughter has request refill on medication that hasn't been refilled by PCP Tye Gall, MD yet. Medication pend and sent to PCP.

## 2024-01-15 ENCOUNTER — Ambulatory Visit: Admitting: Nurse Practitioner

## 2024-01-16 ENCOUNTER — Ambulatory Visit
Admission: RE | Admit: 2024-01-16 | Discharge: 2024-01-16 | Disposition: A | Discharge status: Home / Self Care | Source: Ambulatory Visit | Attending: Pulmonary Disease | Admitting: Pulmonary Disease

## 2024-01-16 DIAGNOSIS — J849 Interstitial pulmonary disease, unspecified: Secondary | ICD-10-CM

## 2024-01-16 DIAGNOSIS — R911 Solitary pulmonary nodule: Secondary | ICD-10-CM

## 2024-01-20 ENCOUNTER — Ambulatory Visit (INDEPENDENT_AMBULATORY_CARE_PROVIDER_SITE_OTHER): Admitting: Podiatry

## 2024-01-20 DIAGNOSIS — M79674 Pain in right toe(s): Secondary | ICD-10-CM | POA: Diagnosis not present

## 2024-01-20 DIAGNOSIS — B351 Tinea unguium: Secondary | ICD-10-CM

## 2024-01-20 DIAGNOSIS — M79675 Pain in left toe(s): Secondary | ICD-10-CM

## 2024-01-20 DIAGNOSIS — I739 Peripheral vascular disease, unspecified: Secondary | ICD-10-CM

## 2024-01-20 NOTE — Progress Notes (Signed)
  Subjective:  Patient ID: Cory Morris, male    DOB: 12-31-1940,   MRN: [id number]  Chief Complaint  Patient presents with   Routine foot care    Rm24/ new patient not diabetic/ very sensitive feet with no history of neuropathy/    83 y.o. male presents for concern of thickened elongated and painful nails that are difficult to trim. Requesting to have them trimmed today. Patient is on eliquis and at risk for foot care.   PCP:  Tye Gall, MD    . Denies any other pedal complaints. Denies n/v/f/c.   Past Medical History:  Diagnosis Date   Aortic atherosclerosis (HCC)    CKD (chronic kidney disease) stage 4, GFR 15-29 ml/min (HCC)    COPD (chronic obstructive pulmonary disease) (HCC)    Dysrhythmia    Family history of adverse reaction to anesthesia    his 2 daughters have N/V   Gout    Hypertension    Permanent atrial fibrillation (HCC)    Prediabetes     Objective:  Physical Exam: Vascular: DP/PT pulses 2/4 bilateral. CFT <3 seconds. Normal hair growth on digits. No edema.  Skin. No lacerations or abrasions bilateral feet. Nails 1-5 bilateral are thickened dystrophic and with subungual debris.   Musculoskeletal: MMT 5/5 bilateral lower extremities in DF, PF, Inversion and Eversion. Deceased ROM in DF of ankle joint.  Neurological: Sensation intact to light touch.   Assessment:   1. Pain due to onychomycosis of toenails of both feet   2. Peripheral arterial disease (HCC)      Plan:  Patient was evaluated and treated and all questions answered. -Discussed and educated patient on foot care, especially with  regards to the vascular, neurological and musculoskeletal systems.  -Mechanically debrided all nails 1-5 bilateral using sterile nail nipper and filed with dremel without incident  -Answered all patient questions -Patient to return  in 3 months for at risk foot care -Patient advised to call the office if any problems or questions arise in the  meantime.   Jennefer Moats, DPM

## 2024-02-05 ENCOUNTER — Encounter: Payer: Self-pay | Admitting: Sports Medicine

## 2024-02-05 NOTE — Telephone Encounter (Signed)
Message routed to PCP Venita Sheffield, MD

## 2024-02-06 NOTE — Telephone Encounter (Signed)
 Sherlynn Madden, MD to Me  [facility] (Selected Message)     02/06/24  9:37 AM He needs to come for in person visit.

## 2024-02-06 NOTE — Telephone Encounter (Signed)
 MyChart message sent to patient notifying in person office visit is recommended. Message also routed to Admin staff to follow up with patient.

## 2024-02-06 NOTE — Telephone Encounter (Signed)
 Donis Ozell SAUNDERS to Me  Nicholaus Darice DELENA Dry, Alondra (Selected Message) MB    02/06/24 10:56 AM I contacted the pt's daughter and scheduled him with Dr. Sherlynn for 02/12/24 at 9:40.

## 2024-02-06 NOTE — Telephone Encounter (Signed)
 Noted

## 2024-02-07 ENCOUNTER — Ambulatory Visit: Payer: Self-pay | Admitting: Pulmonary Disease

## 2024-02-12 ENCOUNTER — Other Ambulatory Visit: Payer: Self-pay

## 2024-02-12 ENCOUNTER — Encounter: Payer: Self-pay | Admitting: Sports Medicine

## 2024-02-12 ENCOUNTER — Inpatient Hospital Stay (HOSPITAL_COMMUNITY)

## 2024-02-12 ENCOUNTER — Telehealth (HOSPITAL_COMMUNITY): Payer: Self-pay | Admitting: Pharmacy Technician

## 2024-02-12 ENCOUNTER — Ambulatory Visit: Admitting: Sports Medicine

## 2024-02-12 ENCOUNTER — Emergency Department (HOSPITAL_COMMUNITY)

## 2024-02-12 ENCOUNTER — Encounter (HOSPITAL_COMMUNITY): Payer: Self-pay

## 2024-02-12 ENCOUNTER — Encounter: Payer: Self-pay | Admitting: Pulmonary Disease

## 2024-02-12 ENCOUNTER — Inpatient Hospital Stay (HOSPITAL_COMMUNITY)
Admission: EM | Admit: 2024-02-12 | Discharge: 2024-03-13 | DRG: 291 | Disposition: E | Source: Ambulatory Visit | Attending: Internal Medicine | Admitting: Internal Medicine

## 2024-02-12 ENCOUNTER — Ambulatory Visit (HOSPITAL_BASED_OUTPATIENT_CLINIC_OR_DEPARTMENT_OTHER): Admitting: General Surgery

## 2024-02-12 ENCOUNTER — Other Ambulatory Visit (HOSPITAL_COMMUNITY): Payer: Self-pay

## 2024-02-12 VITALS — BP 98/62 | HR 116 | Temp 97.3°F | Ht 68.0 in

## 2024-02-12 DIAGNOSIS — N184 Chronic kidney disease, stage 4 (severe): Secondary | ICD-10-CM | POA: Diagnosis present

## 2024-02-12 DIAGNOSIS — N189 Chronic kidney disease, unspecified: Secondary | ICD-10-CM | POA: Diagnosis present

## 2024-02-12 DIAGNOSIS — Z66 Do not resuscitate: Secondary | ICD-10-CM | POA: Diagnosis present

## 2024-02-12 DIAGNOSIS — I10 Essential (primary) hypertension: Secondary | ICD-10-CM | POA: Diagnosis present

## 2024-02-12 DIAGNOSIS — I5082 Biventricular heart failure: Secondary | ICD-10-CM | POA: Diagnosis present

## 2024-02-12 DIAGNOSIS — E871 Hypo-osmolality and hyponatremia: Secondary | ICD-10-CM | POA: Diagnosis present

## 2024-02-12 DIAGNOSIS — M109 Gout, unspecified: Secondary | ICD-10-CM | POA: Diagnosis present

## 2024-02-12 DIAGNOSIS — R570 Cardiogenic shock: Secondary | ICD-10-CM | POA: Diagnosis present

## 2024-02-12 DIAGNOSIS — Z604 Social exclusion and rejection: Secondary | ICD-10-CM | POA: Diagnosis present

## 2024-02-12 DIAGNOSIS — Z515 Encounter for palliative care: Secondary | ICD-10-CM | POA: Diagnosis not present

## 2024-02-12 DIAGNOSIS — R000 Tachycardia, unspecified: Secondary | ICD-10-CM | POA: Diagnosis not present

## 2024-02-12 DIAGNOSIS — Z7189 Other specified counseling: Secondary | ICD-10-CM | POA: Diagnosis not present

## 2024-02-12 DIAGNOSIS — Z923 Personal history of irradiation: Secondary | ICD-10-CM

## 2024-02-12 DIAGNOSIS — D631 Anemia in chronic kidney disease: Secondary | ICD-10-CM | POA: Diagnosis present

## 2024-02-12 DIAGNOSIS — J9621 Acute and chronic respiratory failure with hypoxia: Secondary | ICD-10-CM | POA: Diagnosis present

## 2024-02-12 DIAGNOSIS — I4891 Unspecified atrial fibrillation: Secondary | ICD-10-CM | POA: Insufficient documentation

## 2024-02-12 DIAGNOSIS — Z7901 Long term (current) use of anticoagulants: Secondary | ICD-10-CM | POA: Diagnosis not present

## 2024-02-12 DIAGNOSIS — I482 Chronic atrial fibrillation, unspecified: Principal | ICD-10-CM

## 2024-02-12 DIAGNOSIS — J9601 Acute respiratory failure with hypoxia: Secondary | ICD-10-CM | POA: Diagnosis present

## 2024-02-12 DIAGNOSIS — C3432 Malignant neoplasm of lower lobe, left bronchus or lung: Secondary | ICD-10-CM | POA: Diagnosis present

## 2024-02-12 DIAGNOSIS — I361 Nonrheumatic tricuspid (valve) insufficiency: Secondary | ICD-10-CM | POA: Diagnosis present

## 2024-02-12 DIAGNOSIS — I509 Heart failure, unspecified: Secondary | ICD-10-CM | POA: Diagnosis not present

## 2024-02-12 DIAGNOSIS — I2510 Atherosclerotic heart disease of native coronary artery without angina pectoris: Secondary | ICD-10-CM | POA: Diagnosis not present

## 2024-02-12 DIAGNOSIS — E872 Acidosis, unspecified: Secondary | ICD-10-CM | POA: Diagnosis present

## 2024-02-12 DIAGNOSIS — Z7951 Long term (current) use of inhaled steroids: Secondary | ICD-10-CM

## 2024-02-12 DIAGNOSIS — I130 Hypertensive heart and chronic kidney disease with heart failure and stage 1 through stage 4 chronic kidney disease, or unspecified chronic kidney disease: Secondary | ICD-10-CM | POA: Diagnosis present

## 2024-02-12 DIAGNOSIS — I5021 Acute systolic (congestive) heart failure: Secondary | ICD-10-CM | POA: Diagnosis not present

## 2024-02-12 DIAGNOSIS — Z87891 Personal history of nicotine dependence: Secondary | ICD-10-CM

## 2024-02-12 DIAGNOSIS — R0602 Shortness of breath: Secondary | ICD-10-CM

## 2024-02-12 DIAGNOSIS — I4821 Permanent atrial fibrillation: Secondary | ICD-10-CM | POA: Diagnosis present

## 2024-02-12 DIAGNOSIS — M10161 Lead-induced gout, right knee: Secondary | ICD-10-CM

## 2024-02-12 DIAGNOSIS — I739 Peripheral vascular disease, unspecified: Secondary | ICD-10-CM | POA: Diagnosis present

## 2024-02-12 DIAGNOSIS — F3340 Major depressive disorder, recurrent, in remission, unspecified: Secondary | ICD-10-CM | POA: Diagnosis present

## 2024-02-12 DIAGNOSIS — N179 Acute kidney failure, unspecified: Secondary | ICD-10-CM | POA: Diagnosis present

## 2024-02-12 DIAGNOSIS — R0682 Tachypnea, not elsewhere classified: Secondary | ICD-10-CM

## 2024-02-12 DIAGNOSIS — R7303 Prediabetes: Secondary | ICD-10-CM | POA: Diagnosis present

## 2024-02-12 DIAGNOSIS — Z91148 Patient's other noncompliance with medication regimen for other reason: Secondary | ICD-10-CM

## 2024-02-12 DIAGNOSIS — J449 Chronic obstructive pulmonary disease, unspecified: Secondary | ICD-10-CM | POA: Diagnosis present

## 2024-02-12 DIAGNOSIS — E785 Hyperlipidemia, unspecified: Secondary | ICD-10-CM | POA: Diagnosis present

## 2024-02-12 DIAGNOSIS — R0902 Hypoxemia: Secondary | ICD-10-CM

## 2024-02-12 DIAGNOSIS — I959 Hypotension, unspecified: Secondary | ICD-10-CM

## 2024-02-12 DIAGNOSIS — T560X1A Toxic effect of lead and its compounds, accidental (unintentional), initial encounter: Secondary | ICD-10-CM

## 2024-02-12 DIAGNOSIS — I5033 Acute on chronic diastolic (congestive) heart failure: Secondary | ICD-10-CM | POA: Diagnosis present

## 2024-02-12 DIAGNOSIS — Z888 Allergy status to other drugs, medicaments and biological substances status: Secondary | ICD-10-CM

## 2024-02-12 DIAGNOSIS — Z79899 Other long term (current) drug therapy: Secondary | ICD-10-CM

## 2024-02-12 DIAGNOSIS — R54 Age-related physical debility: Secondary | ICD-10-CM | POA: Diagnosis present

## 2024-02-12 LAB — CBC WITH DIFFERENTIAL/PLATELET
Abs Immature Granulocytes: 0.04 10*3/uL (ref 0.00–0.07)
Basophils Absolute: 0 10*3/uL (ref 0.0–0.1)
Basophils Relative: 0 %
Eosinophils Absolute: 0 10*3/uL (ref 0.0–0.5)
Eosinophils Relative: 0 %
HCT: 44.2 % (ref 39.0–52.0)
Hemoglobin: 13.2 g/dL (ref 13.0–17.0)
Immature Granulocytes: 0 %
Lymphocytes Relative: 5 %
Lymphs Abs: 0.5 10*3/uL — ABNORMAL LOW (ref 0.7–4.0)
MCH: 26.7 pg (ref 26.0–34.0)
MCHC: 29.9 g/dL — ABNORMAL LOW (ref 30.0–36.0)
MCV: 89.5 fL (ref 80.0–100.0)
Monocytes Absolute: 0.7 10*3/uL (ref 0.1–1.0)
Monocytes Relative: 7 %
Neutro Abs: 8 10*3/uL — ABNORMAL HIGH (ref 1.7–7.7)
Neutrophils Relative %: 88 %
Platelets: 231 10*3/uL (ref 150–400)
RBC: 4.94 MIL/uL (ref 4.22–5.81)
RDW: 17.3 % — ABNORMAL HIGH (ref 11.5–15.5)
WBC: 9.2 10*3/uL (ref 4.0–10.5)
nRBC: 0 % (ref 0.0–0.2)

## 2024-02-12 LAB — ECHOCARDIOGRAM COMPLETE
Est EF: <20
Height: 68 in
S' Lateral: 4.1 cm

## 2024-02-12 LAB — COOXEMETRY PANEL
Carboxyhemoglobin: 1.3 % (ref 0.5–1.5)
Methemoglobin: <0.7 % (ref 0.0–1.5)
O2 Saturation: 73.3 %
Total hemoglobin: 11.2 g/dL — ABNORMAL LOW (ref 12.0–16.0)

## 2024-02-12 LAB — I-STAT VENOUS BLOOD GAS, ED
Acid-base deficit: 5 mmol/L — ABNORMAL HIGH (ref 0.0–2.0)
Bicarbonate: 19.8 mmol/L — ABNORMAL LOW (ref 20.0–28.0)
Calcium, Ion: 1 mmol/L — ABNORMAL LOW (ref 1.15–1.40)
HCT: 44 % (ref 39.0–52.0)
Hemoglobin: 15 g/dL (ref 13.0–17.0)
O2 Saturation: 77 %
Potassium: 4.6 mmol/L (ref 3.5–5.1)
Sodium: 135 mmol/L (ref 135–145)
TCO2: 21 mmol/L — ABNORMAL LOW (ref 22–32)
pCO2, Ven: 35.2 mmHg — ABNORMAL LOW (ref 44–60)
pH, Ven: 7.358 (ref 7.25–7.43)
pO2, Ven: 43 mmHg (ref 32–45)

## 2024-02-12 LAB — COMPREHENSIVE METABOLIC PANEL WITH GFR
ALT: 47 U/L — ABNORMAL HIGH (ref 0–44)
AST: 29 U/L (ref 15–41)
Albumin: 3.6 g/dL (ref 3.5–5.0)
Alkaline Phosphatase: 81 U/L (ref 38–126)
Anion gap: 19 — ABNORMAL HIGH (ref 5–15)
BUN: 73 mg/dL — ABNORMAL HIGH (ref 8–23)
CO2: 17 mmol/L — ABNORMAL LOW (ref 22–32)
Calcium: 9.4 mg/dL (ref 8.9–10.3)
Chloride: 98 mmol/L (ref 98–111)
Creatinine, Ser: 3.2 mg/dL — ABNORMAL HIGH (ref 0.61–1.24)
GFR, Estimated: 18 mL/min — ABNORMAL LOW (ref >60)
Glucose, Bld: 120 mg/dL — ABNORMAL HIGH (ref 70–99)
Potassium: 4.4 mmol/L (ref 3.5–5.1)
Sodium: 134 mmol/L — ABNORMAL LOW (ref 135–145)
Total Bilirubin: 1.3 mg/dL — ABNORMAL HIGH (ref 0.0–1.2)
Total Protein: 7.8 g/dL (ref 6.5–8.1)

## 2024-02-12 LAB — BRAIN NATRIURETIC PEPTIDE: B Natriuretic Peptide: 3326.2 pg/mL — ABNORMAL HIGH (ref 0.0–100.0)

## 2024-02-12 LAB — PROTIME-INR
INR: 1.2 (ref 0.8–1.2)
Prothrombin Time: 16.3 s — ABNORMAL HIGH (ref 11.4–15.2)

## 2024-02-12 LAB — LACTIC ACID, PLASMA
Lactic Acid, Venous: 3.3 mmol/L (ref 0.5–1.9)
Lactic Acid, Venous: 3.7 mmol/L (ref 0.5–1.9)

## 2024-02-12 LAB — MRSA NEXT GEN BY PCR, NASAL: MRSA by PCR Next Gen: NOT DETECTED

## 2024-02-12 LAB — MAGNESIUM: Magnesium: 2.4 mg/dL (ref 1.7–2.4)

## 2024-02-12 LAB — TSH: TSH: 1.139 u[IU]/mL (ref 0.350–4.500)

## 2024-02-12 MED ORDER — DILTIAZEM LOAD VIA INFUSION
5.0000 mg | Freq: Once | INTRAVENOUS | Status: AC
  Administered 2024-02-12: 5 mg via INTRAVENOUS
  Filled 2024-02-12: qty 5

## 2024-02-12 MED ORDER — FUROSEMIDE 10 MG/ML IJ SOLN
80.0000 mg | Freq: Two times a day (BID) | INTRAMUSCULAR | Status: DC
  Administered 2024-02-12: 80 mg via INTRAVENOUS
  Filled 2024-02-12: qty 8

## 2024-02-12 MED ORDER — AMIODARONE HCL IN DEXTROSE 360-4.14 MG/200ML-% IV SOLN
60.0000 mg/h | INTRAVENOUS | Status: DC
  Administered 2024-02-12 – 2024-02-14 (×8): 60 mg/h via INTRAVENOUS
  Filled 2024-02-12 (×10): qty 200

## 2024-02-12 MED ORDER — ACETAMINOPHEN 325 MG PO TABS
650.0000 mg | ORAL_TABLET | Freq: Four times a day (QID) | ORAL | Status: DC | PRN
  Administered 2024-02-12 – 2024-02-14 (×4): 650 mg via ORAL
  Filled 2024-02-12 (×4): qty 2

## 2024-02-12 MED ORDER — ACETAMINOPHEN 650 MG RE SUPP: 650.0000 mg | Freq: Four times a day (QID) | RECTAL | Status: DC | PRN

## 2024-02-12 MED ORDER — SODIUM CHLORIDE 0.9% FLUSH
10.0000 mL | Freq: Two times a day (BID) | INTRAVENOUS | Status: DC
  Administered 2024-02-12: 20 mL
  Administered 2024-02-13 – 2024-02-15 (×5): 10 mL

## 2024-02-12 MED ORDER — DILTIAZEM HCL-DEXTROSE 125-5 MG/125ML-% IV SOLN (PREMIX)
5.0000 mg/h | INTRAVENOUS | Status: DC
  Administered 2024-02-12: 5 mg/h via INTRAVENOUS
  Filled 2024-02-12: qty 125

## 2024-02-12 MED ORDER — SODIUM CHLORIDE 0.9% FLUSH: 10.0000 mL | INTRAVENOUS | Status: DC | PRN

## 2024-02-12 MED ORDER — CALCIUM GLUCONATE-NACL 1-0.675 GM/50ML-% IV SOLN
1.0000 g | Freq: Once | INTRAVENOUS | Status: AC
  Administered 2024-02-12: 1000 mg via INTRAVENOUS
  Filled 2024-02-12: qty 50

## 2024-02-12 MED ORDER — FUROSEMIDE 10 MG/ML IJ SOLN: 160.0000 mg | Freq: Two times a day (BID) | INTRAVENOUS | Status: DC

## 2024-02-12 MED ORDER — AMIODARONE HCL IN DEXTROSE 360-4.14 MG/200ML-% IV SOLN
60.0000 mg/h | INTRAVENOUS | Status: DC
  Administered 2024-02-12: 60 mg/h via INTRAVENOUS
  Filled 2024-02-12: qty 200

## 2024-02-12 MED ORDER — POLYETHYLENE GLYCOL 3350 17 G PO PACK: 17.0000 g | PACK | Freq: Every day | ORAL | Status: DC | PRN

## 2024-02-12 MED ORDER — APIXABAN 2.5 MG PO TABS
2.5000 mg | ORAL_TABLET | Freq: Two times a day (BID) | ORAL | Status: DC
  Administered 2024-02-12 – 2024-02-13 (×3): 2.5 mg via ORAL
  Filled 2024-02-12 (×4): qty 1

## 2024-02-12 MED ORDER — MONTELUKAST SODIUM 10 MG PO TABS
10.0000 mg | ORAL_TABLET | Freq: Every day | ORAL | Status: DC
  Administered 2024-02-13 – 2024-02-14 (×2): 10 mg via ORAL
  Filled 2024-02-12 (×2): qty 1

## 2024-02-12 MED ORDER — MILRINONE LACTATE IN DEXTROSE 20-5 MG/100ML-% IV SOLN
0.2500 ug/kg/min | INTRAVENOUS | Status: DC
  Administered 2024-02-12 – 2024-02-14 (×4): 0.25 ug/kg/min via INTRAVENOUS
  Filled 2024-02-12 (×4): qty 100

## 2024-02-12 MED ORDER — MIDODRINE HCL 5 MG PO TABS
10.0000 mg | ORAL_TABLET | Freq: Three times a day (TID) | ORAL | Status: DC
  Administered 2024-02-12: 10 mg via ORAL
  Filled 2024-02-12: qty 2

## 2024-02-12 MED ORDER — FUROSEMIDE 10 MG/ML IJ SOLN
30.0000 mg/h | INTRAVENOUS | Status: DC
  Administered 2024-02-12 – 2024-02-13 (×2): 15 mg/h via INTRAVENOUS
  Administered 2024-02-13 – 2024-02-14 (×5): 30 mg/h via INTRAVENOUS
  Filled 2024-02-12 (×8): qty 20

## 2024-02-12 MED ORDER — CHLORHEXIDINE GLUCONATE CLOTH 2 % EX PADS
6.0000 | MEDICATED_PAD | Freq: Every day | CUTANEOUS | Status: DC
  Administered 2024-02-12 – 2024-02-14 (×3): 6 via TOPICAL

## 2024-02-12 MED ORDER — GERHARDT'S BUTT CREAM
TOPICAL_CREAM | Freq: Three times a day (TID) | CUTANEOUS | Status: DC
  Filled 2024-02-12: qty 60

## 2024-02-12 MED ORDER — AMIODARONE IV BOLUS ONLY 150 MG/100ML
150.0000 mg | Freq: Once | INTRAVENOUS | Status: AC
  Administered 2024-02-12: 150 mg via INTRAVENOUS

## 2024-02-12 MED ORDER — BUDESON-GLYCOPYRROL-FORMOTEROL 160-9-4.8 MCG/ACT IN AERO
2.0000 | INHALATION_SPRAY | Freq: Two times a day (BID) | RESPIRATORY_TRACT | Status: DC
  Administered 2024-02-12 – 2024-02-14 (×3): 2 via RESPIRATORY_TRACT
  Filled 2024-02-12: qty 5.9

## 2024-02-12 MED ORDER — MIDODRINE HCL 5 MG PO TABS
5.0000 mg | ORAL_TABLET | Freq: Three times a day (TID) | ORAL | Status: DC
  Administered 2024-02-12 – 2024-02-13 (×2): 5 mg via ORAL
  Filled 2024-02-12 (×2): qty 1

## 2024-02-12 MED ORDER — SODIUM CHLORIDE 0.9% FLUSH
3.0000 mL | Freq: Two times a day (BID) | INTRAVENOUS | Status: DC
  Administered 2024-02-12 – 2024-02-14 (×5): 3 mL via INTRAVENOUS

## 2024-02-12 MED ORDER — AMIODARONE HCL IN DEXTROSE 360-4.14 MG/200ML-% IV SOLN: 30.0000 mg/h | INTRAVENOUS | Status: DC

## 2024-02-12 MED ORDER — AMIODARONE LOAD VIA INFUSION
150.0000 mg | Freq: Once | INTRAVENOUS | Status: AC
  Administered 2024-02-12: 150 mg via INTRAVENOUS
  Filled 2024-02-12: qty 83.34

## 2024-02-12 NOTE — ED Notes (Signed)
 Diltiazem stopped per cardiology PA

## 2024-02-12 NOTE — Progress Notes (Signed)
 Heart Failure Navigator Progress Note  Assessed for Heart & Vascular TOC clinic readiness.  Patient does not meet criteria due to Advanced Heart Failure Team consulted. .   Navigator will sign off at this time.   Rhae Hammock, BSN, Scientist, clinical (histocompatibility and immunogenetics) Only

## 2024-02-12 NOTE — Telephone Encounter (Signed)
 Patient Product/process development scientist completed.    The patient is insured through [facility]. Patient has Medicare and is not eligible for a copay card, but may be able to apply for patient assistance or Medicare RX Payment Plan (Patient Must reach out to their plan, if eligible for payment plan), if available.    Ran test claim for Entresto 24-26 mg and the current 30 day co-pay is $0.00.  Ran test claim for Farxiga 10 mg and the current 30 day co-pay is $0.00.  Ran test claim for Jardiance 10 mg and the current 30 day co-pay is $0.00.    This test claim was processed through [facility]- copay amounts may vary at other pharmacies due to pharmacy/plan contracts, or as the patient moves through the different stages of their insurance plan.     Roland Earl, CPHT Pharmacy Technician III Certified Patient Advocate [facility] Patient Advocate Team Direct Number: [phone]  Fax: [phone]

## 2024-02-12 NOTE — ED Notes (Signed)
 CCMD contacted to admit the patient for cardiac monitoring.

## 2024-02-12 NOTE — Telephone Encounter (Signed)
**Note De-identified  Woolbright Obfuscation** Please advise 

## 2024-02-12 NOTE — Progress Notes (Signed)
 Peripherally Inserted Central Catheter Placement  The IV Nurse has discussed with the patient and/or persons authorized to consent for the patient, the purpose of this procedure and the potential benefits and risks involved with this procedure.  The benefits include less needle sticks, lab draws from the catheter, and the patient may be discharged home with the catheter. Risks include, but not limited to, infection, bleeding, blood clot (thrombus formation), and puncture of an artery; nerve damage and irregular heartbeat and possibility to perform a PICC exchange if needed/ordered by physician.  Alternatives to this procedure were also discussed.  Bard Power PICC patient education guide, fact sheet on infection prevention and patient information card has been provided to patient /or left at bedside.    PICC Placement Documentation  PICC Triple Lumen 02/12/24 Right Brachial 36 cm 0 cm (Active)  Indication for Insertion or Continuance of Line Vasoactive infusions 02/12/24 1809  Exposed Catheter (cm) 0 cm 02/12/24 1809  Site Assessment Clean, Dry, Intact 02/12/24 1809  Lumen #1 Status Flushed;Saline locked;Blood return noted 02/12/24 1809  Lumen #2 Status Flushed;Saline locked;Blood return noted 02/12/24 1809  Lumen #3 Status Flushed;Saline locked;Blood return noted 02/12/24 1809  Dressing Type Transparent;Securing device 02/12/24 1809  Dressing Status Antimicrobial disc/dressing in place;Clean, Dry, Intact 02/12/24 1809  Line Care Connections checked and tightened 02/12/24 1809  Line Adjustment (NICU/IV Team Only) No 02/12/24 1809  Dressing Intervention New dressing;Adhesive placed at insertion site (IV team only) 02/12/24 1809  Dressing Change Due 02/19/24 02/12/24 1809       Jolee Na 02/12/2024, 6:10 PM

## 2024-02-12 NOTE — H&P (Signed)
 History and Physical   Cory Morris FMW:[id number] DOB: 1940-08-31 DOA: 02/12/2024  PCP: Sherlynn Madden, MD   Patient coming from: Home/PCP office  Chief Complaint: Shortness of breath  HPI: Cory Morris is a 83 y.o. male with medical history significant of hypertension, hyperlipidemia, CKD 4, PAD, gout, anemia, CHF, depression, lung cancer, IPF, COPD presenting with shortness of breath.  History obtained with assistance of chart review and family.  Patient has reportedly not been taking his medications as prescribed recently.  Has not taken and meds including diltiazem, metoprolol , Eliquis for the past 2 days.  And for the past 2 to 3 weeks he is only taking 1 Eliquis per day.  Patient has had worsening shortness of breath for about a day.  Had multiple nebulizer treatments with albuterol  at home overnight without improvement.  Visited PCP today and noted to be tachypneic, tachycardic with soft blood pressure and was sent to the ED for further evaluation.  Patient denies fevers, chills, chest pain, abdominal pain, constipation, diarrhea, nausea, vomiting.  ED Course: Vital signs in the ED notable for temperature 97.3, blood pressure 90s-100 systolic, heart rate 150s-160s, initially requiring nonrebreather to maintain saturation now on high flow nasal cannula.  Lab workup included CMP with sodium 134, bicarb 17, gap 19, BUN 73, creatinine elevated 3.2 from baseline 2.5, glucose 120, ALT 47, T. bili 1.3.  CBC within normal limits.  PT and INR pending.  BNP elevated to 3326.  VBG with normal pH and pCO2 35.2.  Chest x-ray showed small to moderate right pleural effusion and small left pleural effusion.  Patchy bilateral opacities likely representing atelectasis but cannot rule out bronchopneumonia and aspiration.  Patient started on diltiazem infusion for A-fib with RVR in the ED and cardiology was consulted who will see the patient.  Recommended against cardioversion in the ED given  his incomplete Eliquis use recently.  Review of Systems: As per HPI otherwise all other systems reviewed and are negative.  Past Medical History:  Diagnosis Date   Aortic atherosclerosis (HCC)    CKD (chronic kidney disease) stage 4, GFR 15-29 ml/min (HCC)    COPD (chronic obstructive pulmonary disease) (HCC)    Dysrhythmia    Family history of adverse reaction to anesthesia    his 2 daughters have N/V   Gout    Hypertension    Permanent atrial fibrillation (HCC)    Prediabetes     Past Surgical History:  Procedure Laterality Date   ATRIAL FIBRILLATION ABLATION      Social History  reports that he has quit smoking. His smoking use included cigarettes. He has a 60 pack-year smoking history. He has never used smokeless tobacco. He reports current alcohol use. He reports that he does not use drugs.  Allergies  Allergen Reactions   Ace Inhibitors Other (See Comments)    Unknown   Dronedarone Other (See Comments)    Unknown    Family History  Problem Relation Age of Onset   Atrial fibrillation Neg Hx    Arrhythmia Neg Hx   Reviewed on admission  Prior to Admission medications   Medication Sig Start Date End Date Taking? Authorizing Provider  albuterol  (VENTOLIN  HFA) 108 (90 Base) MCG/ACT inhaler INHALE TWO PUFFS BY MOUTH INTO LUNGS EVERY 6 HOURS AS NEEDED FOR WHEEZING/SHORTNESS OF BREATH 09/20/22   Mannam, Praveen, MD  allopurinol  (ZYLOPRIM ) 100 MG tablet Take 1 tablet (100 mg total) by mouth daily. 01/07/24   Veludandi, Prashanthi, MD  cholecalciferol (VITAMIN D3) 25 MCG (  1000 UNIT) tablet Take 1,000 Units by mouth daily.    [provider]  diltiazem (CARDIZEM CD) 120 MG 24 hr capsule Take 120 mg by mouth daily.    [provider]  ELIQUIS 2.5 MG TABS tablet Take 2.5 mg by mouth 2 (two) times daily. 09/24/21   [provider]  Fluticasone-Umeclidin-Vilant (TRELEGY ELLIPTA ) 200-62.5-25 MCG/ACT AEPB INHALE 1 PUFF INTO THE LUNGS DAILY 10/21/23   Hope Almarie ORN, NP  ipratropium-albuterol  (DUONEB) 0.5-2.5 (3) MG/3ML SOLN Take 3 mLs by nebulization every 6 (six) hours as needed. 12/21/22   Mannam, Praveen, MD  latanoprost (XALATAN) 0.005 % ophthalmic solution Place 1 drop into both eyes at bedtime.    [provider]  metoprolol  tartrate (LOPRESSOR ) 100 MG tablet Take 100 mg by mouth 2 (two) times daily.    [provider]  montelukast (SINGULAIR) 10 MG tablet Take 10 mg by mouth daily. 08/19/22   [provider]  Probiotic Product (PROBIOTIC DAILY PO) Take 220 mg by mouth daily. 60 billions probiotic 10 strains    [provider]  torsemide  (DEMADEX ) 20 MG tablet Take 1 tablet (20 mg total) by mouth 2 (two) times daily. 09/13/23   Mona Vinie BROCKS, MD    Physical Exam: Vitals:   02/12/24 1130 02/12/24 1145 02/12/24 1200 02/12/24 1215  BP:   102/89 96/70  Pulse: (!) 162 (!) 155 (!) 158 (!) 161  Resp: 20 20 (!) 23 (!) 25  SpO2: (!) 85% 100% 100% 100%    Physical Exam Constitutional:      General: He is not in acute distress.    Appearance: He is ill-appearing.  HENT:     Head: Normocephalic and atraumatic.     Mouth/Throat:     Mouth: Mucous membranes are moist.     Pharynx: Oropharynx is clear.  Eyes:     Extraocular Movements: Extraocular movements intact.     Pupils: Pupils are equal, round, and reactive to light.  Cardiovascular:     Rate and Rhythm: Tachycardia present. Rhythm irregular.     Pulses: Normal pulses.     Heart sounds: Normal heart sounds.     Comments: Cool distal extremities Pulmonary:     Effort: Pulmonary effort is normal. No respiratory distress.     Breath sounds: Normal breath sounds.  Abdominal:     General: Bowel sounds are normal. There is no distension.     Palpations: Abdomen is soft.     Tenderness: There is no abdominal tenderness.  Musculoskeletal:        General: No swelling or deformity.     Right lower leg: Edema present.     Left lower leg: Edema  present.  Skin:    General: Skin is dry.  Neurological:     General: No focal deficit present.     Mental Status: Mental status is at baseline.    Labs on Admission: I have personally reviewed following labs and imaging studies  CBC: Recent Labs  Lab 02/12/24 1107 02/12/24 1112  WBC 9.2  --   NEUTROABS 8.0*  --   HGB 13.2 15.0  HCT 44.2 44.0  MCV 89.5  --   PLT 231  --     Basic Metabolic Panel: Recent Labs  Lab 02/12/24 1107 02/12/24 1112  NA 134* 135  K 4.4 4.6  CL 98  --   CO2 17*  --   GLUCOSE 120*  --   BUN 73*  --  CREATININE 3.20*  --   CALCIUM 9.4  --     GFR: CrCl cannot be calculated (Unknown ideal weight.).  Liver Function Tests: Recent Labs  Lab 02/12/24 1107  AST 29  ALT 47*  ALKPHOS 81  BILITOT 1.3*  PROT 7.8  ALBUMIN 3.6    Urine analysis:    Component Value Date/Time   COLORURINE YELLOW 04/29/2023 1634   APPEARANCEUR CLEAR 04/29/2023 1634   LABSPEC 1.009 04/29/2023 1634   PHURINE 5.0 04/29/2023 1634   GLUCOSEU NEGATIVE 04/29/2023 1634   HGBUR MODERATE (A) 04/29/2023 1634   BILIRUBINUR NEGATIVE 04/29/2023 1634   KETONESUR NEGATIVE 04/29/2023 1634   PROTEINUR 30 (A) 04/29/2023 1634   NITRITE NEGATIVE 04/29/2023 1634   LEUKOCYTESUR NEGATIVE 04/29/2023 1634    Radiological Exams on Admission: DG Chest Portable 1 View Result Date: 02/12/2024 CLINICAL DATA:  SOB. Eval for pneumonia/pulm edema EXAM: PORTABLE CHEST - 1 VIEW COMPARISON:  September 13, 2021, January 16, 2024 FINDINGS: Small to moderate right pleural effusion. Small left pleural effusion. Peripheral and bibasilar predominant interstitial opacities. Patchy opacities in the lung bases. No pneumothorax. Mild cardiomegaly. Aortic atherosclerosis. No acute fracture or destructive lesions. Multilevel thoracic osteophytosis. IMPRESSION: Small to moderate right pleural effusion with small left pleural effusion. Patchy opacities in both lung bases, likely atelectasis. Alternatively,  superimposed bronchopneumonia or aspiration could have this appearance in the correct clinical context. Electronically Signed   By: Rogelia Myers M.D.   On: 02/12/2024 11:56   EKG: Independently reviewed.  Atrial fibrillation with RVR at 171 bpm.  Right bundle blanch block.  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  Found to be hypoxic requiring initially nonrebreather now on high flow nasal cannula. > Likely secondary to A-fib with RVR and CHF exacerbation as below. - Continue with supplemental oxygen, wean as tolerated - Monitor in progressive unit for now - Address A-fib and CHF as below  Atrial fibrillation with RVR > Known history of A-fib is prescribed diltiazem, metoprolol , Eliquis.  Only 1 dose a day of Eliquis for the past 2 to 3 weeks and has had no diltiazem, metoprolol , nor Eliquis for the past 2 days. > Now in RVR with heart rate initially 171 and hypoxic as above. > Heart rate improving to the 140s on diltiazem but having to carefully titrate in the ED with low normal blood pressure. > Cardiology consulted in the ED recommended against cardioversion in the ED and will see the patient. - Appreciate cardiology recommendations and assistance - Will continue with diltiazem for now if blood pressure cannot tolerate may need to  transition to amiodarone but would be at risk for chemical cardioversion. - Restart home Eliquis ADDENDUM - Patient being transition to amiodarone  Acute on chronic CHF Tricuspid regurgitation > Last echo was in 2024 with EF 50-55%, mildly reduced RV  function, severe tricuspid regurgitation. > BNP elevated to 3326 in the ED in the setting of A-fib with RVR as above. > Bedside POCUS by cardiology demonstrated low output and advanced heart failure team was consulted > Valvular disease likely contributing and now in exacerbation in the setting of CHF. - Appreciate cardiology recommendations and assistance - Hold off on diuretic for now given borderline blood pressure and on diltiazem - Strict I's and O's, daily weights - Check magnesium - Trend renal function and electrolytes ADDENDUM > With new low output heart failure in the setting of A-fib with RVR and low blood pressures plan is for switch to amiodarone and milrinone for diuresis. > Patient is okay with trial of these IV supportive therapies but would likely not want to stay on them long-term. > He is DNR/DNI and will defer to cardiology if he is a candidate for any further advanced therapies, unclear if he would want to try advanced therapies at this time  AKI on CKD 4 > Creatinine elevated to 3.2 from baseline 2.5.  In the setting of A-fib with RVR and CHF as above. > Will need to correct RVR to improve hemodynamics and monitor response to this.  Holding off on diuresis for now as above. - Trend renal function and electrolytes  Hypertension - Holding diltiazem and metoprolol  for now  Hyperlipidemia - Not on medication for this  PAD - Continue home Eliquis  Gout - Holding allopurinol   Anemia - Normal hemoglobin in the ED  History of lung cancer - Noted  IPF COPD - Replace home Trelegy with formulary Breztri - Continue home Singulair   DVT prophylaxis: Eliquis Code Status:   DNR/DNI Family Communication:  Updated  at bedside Disposition Plan:   Patient is from:  Home  Anticipated DC to:  Home  Anticipated DC date:  3 to 6 days  Anticipated DC barriers: None  Consults called:  Cardiology Admission status:  Inpatient, progressive  Severity of Illness: The appropriate patient status for this patient is INPATIENT. Inpatient status is judged to be reasonable and necessary in order to provide the required intensity of service to ensure the patient's safety. The patient's presenting symptoms, physical exam findings, and initial radiographic and laboratory data in the context of their chronic comorbidities is felt to place them at high risk for further clinical deterioration. Furthermore, it is not anticipated that the patient will be medically stable for discharge from the hospital within 2 midnights of admission.   * I certify that at the point of admission it is my clinical judgment that the patient will require inpatient hospital care spanning beyond 2 midnights from the point of admission due to high intensity of service, high risk for further deterioration and high frequency of surveillance required.Cory Marsa KATHEE Seena MD [facility]  How to contact the [facility] Attending or Consulting provider 7A - 7P or covering provider during after hours 7P -7A, for this patient?   Check the care team in [facility] and look for a) attending/consulting [facility] provider listed and b) the [facility] team listed Log into [email or web address] and use [facility] universal password to access. If you do not have the password, please contact the hospital operator. Locate the [facility] provider you are looking for under [facility] and page to a number that you can be directly reached. If you still have difficulty reaching the provider, please page the [facility] (Director on Call) for the Hospitalists listed on amion for assistance.  02/12/2024, 12:50 PM

## 2024-02-12 NOTE — ED Provider Notes (Signed)
 [place] EMERGENCY DEPARTMENT AT [facility] Provider Note   CSN: [id number] Arrival date & time: 02/12/24  1038     Patient presents with: Shortness of Breath   Cory Morris is a 83 y.o. male.   HPI   Presents due to shortness of breath.  According to daughter at bedside, patient has not been compliant with his medication here recently.  Patient's not been taking his diltiazem or metoprolol  for the past 2 days.  Also has not taken his Eliquis over the past 2 days.  Is also been only taking his Eliquis 1 time daily for the past 2 to 3 weeks as well.  Daughter noticed that he was becoming increasingly short of breath last night.  He uses albuterol  inhaler multiple times last night without significant relief.  They subsequent with PCP he referred him here.  Is not have any, chest pain.  Does endorse some slight shortness of breath.  He states he is noticing swelling in his bilateral lower extremities that he feels like is increasing in nature.  Previous medical history reviewed : Patient was seen by PCP today.  Sent to ED for further evaluation.  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   Prior to Admission medications   Medication Sig Start Date End Date Taking? Authorizing Provider  albuterol  (VENTOLIN  HFA) 108 (90 Base) MCG/ACT inhaler INHALE TWO PUFFS BY MOUTH INTO LUNGS EVERY 6 HOURS AS NEEDED FOR WHEEZING/SHORTNESS OF BREATH 09/20/22   Mannam, Praveen, MD  allopurinol  (ZYLOPRIM ) 100 MG tablet Take 1 tablet (100 mg total) by mouth daily. 01/07/24   Sherlynn Madden, MD  cholecalciferol (VITAMIN D3) 25 MCG (1000 UNIT) tablet Take 1,000 Units by mouth daily.    [provider]  diltiazem (CARDIZEM CD) 120 MG 24 hr capsule Take 120 mg by mouth daily.    [provider]  ELIQUIS 2.5 MG TABS tablet Take 2.5 mg by mouth 2 (two) times daily. 09/24/21   [provider]  Fluticasone-Umeclidin-Vilant (TRELEGY ELLIPTA ) 200-62.5-25 MCG/ACT AEPB  INHALE 1 PUFF INTO THE LUNGS DAILY 10/21/23   Hope Almarie ORN, NP  ipratropium-albuterol  (DUONEB) 0.5-2.5 (3) MG/3ML SOLN Take 3 mLs by nebulization every 6 (six) hours as needed. 12/21/22   Mannam, Praveen, MD  latanoprost (XALATAN) 0.005 % ophthalmic solution Place 1 drop into both eyes at bedtime.    [provider]  metoprolol  tartrate (LOPRESSOR ) 100 MG tablet Take 100 mg by mouth 2 (two) times daily.    [provider]  montelukast (SINGULAIR) 10 MG tablet Take 10 mg by mouth daily. 08/19/22   [provider]  Probiotic Product (PROBIOTIC DAILY PO) Take 220 mg by mouth daily. 60 billions probiotic 10 strains    [provider]  torsemide  (DEMADEX ) 20 MG tablet Take 1 tablet (20 mg total) by mouth 2 (two) times daily. 09/13/23   Mona Vinie BROCKS, MD    Allergies: Ace inhibitors and Dronedarone    Review of Systems  Constitutional:  Negative for chills and fever.  HENT:  Negative for ear pain and sore throat.   Eyes:  Negative for pain and visual disturbance.  Respiratory:  Positive for shortness of breath. Negative for cough.   Cardiovascular:  Negative for chest pain and palpitations.  Gastrointestinal:  Negative for abdominal pain and vomiting.  Genitourinary:  Negative for dysuria and hematuria.  Musculoskeletal:  Negative for arthralgias and back pain.  Skin:  Negative for color change and rash.  Neurological:  Negative for  seizures and syncope.  All other systems reviewed and are negative.   Updated Vital Signs BP 96/70   Pulse (!) 124   Temp (!) 96.7 F (35.9 C) (Rectal)   Resp (!) 23   Wt 79 kg   SpO2 100%   BMI 26.48 kg/m   Physical Exam Vitals and nursing note reviewed.  Constitutional:      General: He is not in acute distress.    Appearance: He is well-developed.  HENT:     Head: Normocephalic and atraumatic.  Eyes:     Conjunctiva/sclera: Conjunctivae normal.  Cardiovascular:     Rate and Rhythm: Tachycardia present.  Rhythm irregular.     Heart sounds: No murmur heard. Pulmonary:     Effort: Pulmonary effort is normal. No respiratory distress.     Breath sounds: Normal breath sounds.  Abdominal:     Palpations: Abdomen is soft.     Tenderness: There is no abdominal tenderness.  Musculoskeletal:        General: No swelling.     Cervical back: Neck supple.  Skin:    General: Skin is warm and dry.     Capillary Refill: Capillary refill takes less than 2 seconds.  Neurological:     Mental Status: He is alert.  Psychiatric:        Mood and Affect: Mood normal.     (all labs ordered are listed, but only abnormal results are displayed) Labs Reviewed  COMPREHENSIVE METABOLIC PANEL WITH GFR - Abnormal; Notable for the following components:      Result Value   Sodium 134 (*)    CO2 17 (*)    Glucose, Bld 120 (*)    BUN 73 (*)    Creatinine, Ser 3.20 (*)    ALT 47 (*)    Total Bilirubin 1.3 (*)    GFR, Estimated 18 (*)    Anion gap 19 (*)    All other components within normal limits  CBC WITH DIFFERENTIAL/PLATELET - Abnormal; Notable for the following components:   MCHC 29.9 (*)    RDW 17.3 (*)    Neutro Abs 8.0 (*)    Lymphs Abs 0.5 (*)    All other components within normal limits  BRAIN NATRIURETIC PEPTIDE - Abnormal; Notable for the following components:   B Natriuretic Peptide 3,326.2 (*)    All other components within normal limits  PROTIME-INR - Abnormal; Notable for the following components:   Prothrombin Time 16.3 (*)    All other components within normal limits  LACTIC ACID, PLASMA - Abnormal; Notable for the following components:   Lactic Acid, Venous 3.3 (*)    All other components within normal limits  I-STAT VENOUS BLOOD GAS, ED - Abnormal; Notable for the following components:   pCO2, Ven 35.2 (*)    Bicarbonate 19.8 (*)    TCO2 21 (*)    Acid-base deficit 5.0 (*)    Calcium, Ion 1.00 (*)    All other components within normal limits  MAGNESIUM  LACTIC ACID, PLASMA   TSH    EKG: EKG Interpretation Date/Time:  Wednesday February 12 2024 10:55:22 EDT Ventricular Rate:  171 PR Interval:    QRS Duration:  138 QT Interval:  291 QTC Calculation: 491 R Axis:   107  Text Interpretation: Atrial fibrillation Right bundle branch block Confirmed by Simon Rea [phone]) on 02/12/2024 11:07:25 AM  Radiology: ECHOCARDIOGRAM COMPLETE Result Date: 02/12/2024    ECHOCARDIOGRAM REPORT   Patient Name:   Inez  GORMAN FOX Date of Exam: 02/12/2024 Medical Rec #:  [id number]        Height:       68.0 in Accession #:    [id number]       Weight:       176.0 lb Date of Birth:  02-06-1941       BSA:          1.936 m Patient Age:    82 years         BP:           138/118 mmHg Patient Gender: M                HR:           140 bpm. Exam Location:  Inpatient Procedure: 2D Echo (Both Spectral and Color Flow Doppler were utilized during            procedure). STAT ECHO Indications:    CAD of native vessel  History:        Patient has no prior history of Echocardiogram examinations.                 COPD and chronic kidney disease, Arrythmias:Atrial Fibrillation;                 Risk Factors:Hypertension and Dyslipidemia.  Sonographer:    Tinnie Barefoot RDCS Referring Phys: [id number] Maryruth Apple N Ana Woodroof IMPRESSIONS  1. Left ventricular ejection fraction, by estimation, is <20%. The left ventricle has severely decreased function. The left ventricle demonstrates global hypokinesis. Left ventricular diastolic parameters are indeterminate.  2. Right ventricular systolic function is severely reduced. The right ventricular size is moderately enlarged. There is normal pulmonary artery systolic pressure. The estimated right ventricular systolic pressure is 30.7 mmHg.  3. Left atrial size was mildly dilated.  4. Right atrial size was mildly dilated.  5. The mitral valve is normal in structure. Trivial mitral valve regurgitation. No evidence of mitral stenosis.  6. The tricuspid valve is abnormal. Tricuspid valve  regurgitation is moderate to severe, possibly severe given hepatic vein systolic doppler flow reversal.  7. The aortic valve is tricuspid. There is mild calcification of the aortic valve. Aortic valve regurgitation is trivial. No aortic stenosis is present.  8. The inferior vena cava is dilated in size with <50% respiratory variability, suggesting right atrial pressure of 15 mmHg. FINDINGS  Left Ventricle: Left ventricular ejection fraction, by estimation, is <20%. The left ventricle has severely decreased function. The left ventricle demonstrates global hypokinesis. The left ventricular internal cavity size was normal in size. There is no  left ventricular hypertrophy. Left ventricular diastolic parameters are indeterminate. Right Ventricle: The right ventricular size is moderately enlarged. No increase in right ventricular wall thickness. Right ventricular systolic function is severely reduced. There is normal pulmonary artery systolic pressure. The tricuspid regurgitant velocity is 1.98 m/s, and with an assumed right atrial pressure of 15 mmHg, the estimated right ventricular systolic pressure is 30.7 mmHg. Left Atrium: Left atrial size was mildly dilated. Right Atrium: Right atrial size was mildly dilated. Pericardium: Trivial pericardial effusion is present. Mitral Valve: The mitral valve is normal in structure. Mild to moderate mitral annular calcification. Trivial mitral valve regurgitation. No evidence of mitral valve stenosis. Tricuspid Valve: The tricuspid valve is abnormal. Tricuspid valve regurgitation is moderate to severe. Aortic Valve: The aortic valve is tricuspid. There is mild calcification of the aortic valve. Aortic valve regurgitation is trivial. No aortic stenosis is present. Pulmonic Valve:  The pulmonic valve was normal in structure. Pulmonic valve regurgitation is not visualized. Aorta: The aortic root is normal in size and structure. Venous: The inferior vena cava is dilated in size with  less than 50% respiratory variability, suggesting right atrial pressure of 15 mmHg. IAS/Shunts: No atrial level shunt detected by color flow Doppler.  LEFT VENTRICLE PLAX 2D LVIDd:         4.60 cm LVIDs:         4.10 cm LV PW:         0.90 cm LV IVS:        0.80 cm LVOT diam:     1.90 cm LVOT Area:     2.84 cm  RIGHT VENTRICLE          IVC RV Basal diam:  3.20 cm  IVC diam: 2.40 cm LEFT ATRIUM             Index        RIGHT ATRIUM           Index LA diam:        3.80 cm 1.96 cm/m   RA Area:     19.40 cm LA Vol (A2C):   62.4 ml 32.24 ml/m  RA Volume:   63.10 ml  32.60 ml/m LA Vol (A4C):   71.5 ml 36.94 ml/m LA Biplane Vol: 67.4 ml 34.82 ml/m   AORTA Ao Root diam: 3.40 cm TRICUSPID VALVE TR Peak grad:   15.7 mmHg TR Vmax:        198.00 cm/s  SHUNTS Systemic Diam: 1.90 cm Dalton McleanMD Electronically signed by Ezra Kanner Signature Date/Time: 02/12/2024/1:46:17 PM    Final    US  EKG SITE RITE Result Date: 02/12/2024 If Site Rite image not attached, placement could not be confirmed due to current cardiac rhythm.  DG Chest Portable 1 View Result Date: 02/12/2024 CLINICAL DATA:  SOB. Eval for pneumonia/pulm edema EXAM: PORTABLE CHEST - 1 VIEW COMPARISON:  September 13, 2021, January 16, 2024 FINDINGS: Small to moderate right pleural effusion. Small left pleural effusion. Peripheral and bibasilar predominant interstitial opacities. Patchy opacities in the lung bases. No pneumothorax. Mild cardiomegaly. Aortic atherosclerosis. No acute fracture or destructive lesions. Multilevel thoracic osteophytosis. IMPRESSION: Small to moderate right pleural effusion with small left pleural effusion. Patchy opacities in both lung bases, likely atelectasis. Alternatively, superimposed bronchopneumonia or aspiration could have this appearance in the correct clinical context. Electronically Signed   By: Rogelia Myers M.D.   On: 02/12/2024 11:56     Procedures   Medications Ordered in the ED  amiodarone (NEXTERONE) 1.8  mg/mL load via infusion 150 mg (150 mg Intravenous Bolus from Bag 02/12/24 1259)    Followed by  amiodarone (NEXTERONE PREMIX) 360-4.14 MG/200ML-% (1.8 mg/mL) IV infusion (60 mg/hr Intravenous New Bag/Given 02/12/24 1258)    Followed by  amiodarone (NEXTERONE PREMIX) 360-4.14 MG/200ML-% (1.8 mg/mL) IV infusion (has no administration in time range)  budesonide-glycopyrrolate-formoterol (BREZTRI) 160-9-4.8 MCG/ACT inhaler 2 puff (has no administration in time range)  montelukast (SINGULAIR) tablet 10 mg (has no administration in time range)  sodium chloride  flush (NS) 0.9 % injection 3 mL (has no administration in time range)  acetaminophen  (TYLENOL ) tablet 650 mg (has no administration in time range)    Or  acetaminophen  (TYLENOL ) suppository 650 mg (has no administration in time range)  polyethylene glycol (MIRALAX / GLYCOLAX) packet 17 g (has no administration in time range)  milrinone (PRIMACOR) 20 MG/100 ML (0.2 mg/mL) infusion (has  no administration in time range)  diltiazem (CARDIZEM) 1 mg/mL load via infusion 5 mg (5 mg Intravenous Bolus from Bag 02/12/24 1132)    Clinical Course as of 02/12/24 1349  Wed Feb 12, 2024  1116 1. Poor acoustic windows limit study. RV free wall difficult to see  apical window is foreshortened.   2. Left ventricular ejection fraction, by estimation, is 50 to 55%. The  left ventricle has low normal function.   3. RV free wall difficult to see well . Right ventricular systolic  function is moderately reduced. The right ventricular size is moderately  enlarged.   4. Left atrial size was moderately dilated.   5. Right atrial size was moderately dilated.   6. Mild mitral valve regurgitation.   7. Tricuspid valve regurgitation is severe.   8. The aortic valve is tricuspid. Aortic valve regurgitation is mild.  Aortic valve sclerosis/calcification is present, without any evidence of  aortic stenosis.   9. The inferior vena cava is dilated in size with <50%  respiratory  variability, suggesting right atrial pressure of 15 mmHg.   [TL]    Clinical Course User Index [TL] Simon Lavonia SAILOR, MD                                 Medical Decision Making Amount and/or Complexity of Data Reviewed Labs: ordered. Radiology: ordered.  Risk Decision regarding hospitalization.   Presents due to shortness of breath.  According to daughter at bedside, patient has not been compliant with his medication here recently.  Patient's not been taking his diltiazem or metoprolol  for the past 2 days.  Also has not taken his Eliquis over the past 2 days.  Is also been only taking his Eliquis 1 time daily for the past 2 to 3 weeks as well.  Daughter noticed that he was becoming increasingly short of breath last night.  He uses albuterol  inhaler multiple times last night without significant relief.  They subsequent with PCP he referred him here.  Is not have any, chest pain.  Does endorse some slight shortness of breath.  He states he is noticing swelling in his bilateral lower extremities that he feels like is increasing in nature.  Previous medical history reviewed : Patient was seen by PCP today.  Sent to ED for further evaluation.  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   Arrived hypoxic and unstable..  Initially had to be placed on a nonrebreather in the setting of hypoxia.  Was unable to obtain a pulse ox.  Patient was also blue and cyanotic.  After nonrebreather was placed, patient's pulse ox increased into the high 90s to 100%. Was able to transition to high flow at that time.   Evaluated the EKG consistent with A-fib with RVR.  Did not want to immediately cardiovert the patient in the setting of patient's missed Eliquis dosing.  High risk for any kind of CVA.  Will have to consider cardioversion if patient maps drop or becomes any kind more altered.  Confirmed with patient and daughter that patient is a DNR status.  This we started patient on the very small  dose diltiazem.  Reviewed last EF.  EF of 55% 1 year ago.  Concerned that he could have a form of cardiomyopathy in the setting of probable A-fib with RVR for unspecified amount of days.  Will be very cautious with his diltiazem drip at this point time  do not would drop his pressure too much.  Consulted cardiology. They saw the patient. Will likely start amiodarone.  CRITICAL CARE Performed by: Lavonia LOISE Pat   Total critical care time: 45 minutes  Critical care time was exclusive of separately billable procedures and treating other patients.  Critical care was necessary to treat or prevent imminent or life-threatening deterioration.  Critical care was time spent personally by me on the following activities: development of treatment plan with patient and/or surrogate as well as nursing, discussions with consultants, evaluation of patient's response to treatment, examination of patient, obtaining history from patient or surrogate, ordering and performing treatments and interventions, ordering and review of laboratory studies, ordering and review of radiographic studies, pulse oximetry and re-evaluation of patient's condition. care      Final diagnoses:  Atrial fibrillation with rapid ventricular response (HCC)  Hypoxia  Congestive heart failure, unspecified HF chronicity, unspecified heart failure type [facility])    ED Discharge Orders          Ordered    Amb referral to AFIB Clinic        02/12/24 1249               Pat Lavonia LOISE, MD 02/12/24 1350

## 2024-02-12 NOTE — Patient Instructions (Signed)
 Pt c/o SOB with rest Used albuterol  nebulizer treatments 15 times last night  He is able to speak in 2-3 word sentences Tachypnea+ Pt reports that his urine out put decreased  Tachycardic, hypotensive  Reports decreased urine out out  Will send the patient to Emergency room

## 2024-02-12 NOTE — ED Triage Notes (Signed)
 Pt sent by PCP for further evaluation of SOB. Pt has hx of CHF. Pt cyanotic upon arrival to treatment room, unable to get a room air saturation due to cold fingers. Pt placed on NRB at 15L. Pt HR 170s. Pt alert and oriented, denies chest pain. Triage tech unable to get oral or axillary temp in triage.

## 2024-02-12 NOTE — Progress Notes (Signed)
  Echocardiogram 2D Echocardiogram has been performed.  Cory Morris 02/12/2024, 1:34 PM

## 2024-02-12 NOTE — ED Notes (Signed)
 Called lab to add TSH

## 2024-02-12 NOTE — Plan of Care (Signed)
  Problem: Education: Goal: Knowledge of General Education information will improve Description: Including pain rating scale, medication(s)/side effects and non-pharmacologic comfort measures 02/12/2024 2116 by Lilian Glenys SAUNDERS, RN Outcome: Progressing 02/12/2024 2047 by Lilian Glenys SAUNDERS, RN Outcome: Progressing   Problem: Health Behavior/Discharge Planning: Goal: Ability to manage health-related needs will improve 02/12/2024 2116 by Armond Cuthrell, Glenys SAUNDERS, RN Outcome: Progressing 02/12/2024 2047 by Lilian Glenys SAUNDERS, RN Outcome: Progressing   Problem: Clinical Measurements: Goal: Ability to maintain clinical measurements within normal limits will improve 02/12/2024 2116 by Lilian Glenys SAUNDERS, RN Outcome: Progressing 02/12/2024 2047 by Lilian Glenys SAUNDERS, RN Outcome: Progressing Goal: Will remain free from infection 02/12/2024 2116 by Lilian Glenys SAUNDERS, RN Outcome: Progressing 02/12/2024 2047 by Lilian Glenys SAUNDERS, RN Outcome: Progressing Goal: Diagnostic test results will improve 02/12/2024 2116 by Lilian Glenys SAUNDERS, RN Outcome: Progressing 02/12/2024 2047 by Lilian Glenys SAUNDERS, RN Outcome: Progressing Goal: Respiratory complications will improve 02/12/2024 2116 by Lilian Glenys SAUNDERS, RN Outcome: Progressing 02/12/2024 2047 by Lilian Glenys SAUNDERS, RN Outcome: Progressing Goal: Cardiovascular complication will be avoided 02/12/2024 2116 by Lilian Glenys SAUNDERS, RN Outcome: Progressing 02/12/2024 2047 by Lilian Glenys SAUNDERS, RN Outcome: Progressing   Problem: Activity: Goal: Risk for activity intolerance will decrease 02/12/2024 2116 by Lilian Glenys SAUNDERS, RN Outcome: Progressing 02/12/2024 2047 by Lilian Glenys SAUNDERS, RN Outcome: Progressing   Problem: Nutrition: Goal: Adequate nutrition will be maintained 02/12/2024 2116 by Lilian Glenys SAUNDERS, RN Outcome: Progressing 02/12/2024 2047 by Lilian Glenys SAUNDERS, RN Outcome: Progressing   Problem: Coping: Goal: Level of anxiety will decrease 02/12/2024  2116 by Lilian Glenys SAUNDERS, RN Outcome: Progressing 02/12/2024 2047 by Lilian Glenys SAUNDERS, RN Outcome: Progressing   Problem: Elimination: Goal: Will not experience complications related to bowel motility 02/12/2024 2116 by Lilian Glenys SAUNDERS, RN Outcome: Progressing 02/12/2024 2047 by Lilian Glenys SAUNDERS, RN Outcome: Progressing Goal: Will not experience complications related to urinary retention 02/12/2024 2116 by Lilian Glenys SAUNDERS, RN Outcome: Progressing 02/12/2024 2047 by Lilian Glenys SAUNDERS, RN Outcome: Progressing   Problem: Pain Managment: Goal: General experience of comfort will improve and/or be controlled 02/12/2024 2116 by Lilian Glenys SAUNDERS, RN Outcome: Progressing 02/12/2024 2047 by Lilian Glenys SAUNDERS, RN Outcome: Progressing   Problem: Safety: Goal: Ability to remain free from injury will improve 02/12/2024 2116 by Lilian Glenys SAUNDERS, RN Outcome: Progressing 02/12/2024 2047 by Lilian Glenys SAUNDERS, RN Outcome: Progressing   Problem: Skin Integrity: Goal: Risk for impaired skin integrity will decrease 02/12/2024 2116 by Lilian Glenys SAUNDERS, RN Outcome: Progressing 02/12/2024 2047 by Lilian Glenys SAUNDERS, RN Outcome: Progressing   Problem: Education: Goal: Ability to demonstrate management of disease process will improve Outcome: Progressing

## 2024-02-12 NOTE — Progress Notes (Addendum)
 Careteam: Patient Care Team: Sherlynn Madden, MD as PCP - General (Internal Medicine) Mona Vinie BROCKS, MD as PCP - Cardiology (Cardiology)  PLACE OF SERVICE:  [facility]  Advanced Directive information    Allergies  Allergen Reactions   Ace Inhibitors Other (See Comments)    Unknown   Dronedarone Other (See Comments)    Unknown    Chief Complaint  Patient presents with   Acute Visit     Both ankles and knees has pain / the left ankle and knee has more pain than the other , he's not able to urinate and has been trouble breathing.      Discussed the use of AI scribe software for clinical note transcription with the patient, who gave verbal consent to proceed.  History of Present Illness  Cory Morris is an 83 year old male with COPD who presents with worsening shortness of breath. He is accompanied by  his daughter.  He has experienced worsening shortness of breath over the past two days, requiring increased use of his albuterol  inhaler, up to 19 times in one night. His oxygen saturation was recorded at 80% by a nurse, although he reported a reading of 100% at home earlier. He experiences shortness of breath even at rest, while sitting in a recliner, and denies any wheezing.  He has a persistent cough, which has not worsened, and denies significant phlegm production. He experiences frequent nausea but has not vomited. His appetite has decreased, and he primarily consumes soup. He feels tired and has low energy levels.  He has a history of kidney disease and reports decreased urine output. No hematuria. He also reports difficulty lying flat due to breathing issues and leg pain, and he uses a walker due to worsening mobility.  He experiences significant leg pain, particularly in one leg, and has been using prednisone , which did not alleviate the pain.    Review of Systems:  Review of Systems  Constitutional:  Positive for malaise/fatigue. Negative for chills and  fever.  HENT:  Negative for congestion and sore throat.   Eyes:  Negative for double vision.  Respiratory:  Positive for cough, sputum production and shortness of breath.   Cardiovascular:  Negative for chest pain, palpitations and leg swelling.  Gastrointestinal:  Positive for nausea. Negative for abdominal pain and heartburn.  Genitourinary:  Negative for dysuria.       Decreased urine output   Musculoskeletal:  Negative for falls and myalgias.  Neurological:  Negative for dizziness, sensory change and focal weakness.   Negative unless indicated in HPI.   Past Medical History:  Diagnosis Date   Aortic atherosclerosis (HCC)    CKD (chronic kidney disease) stage 4, GFR 15-29 ml/min (HCC)    COPD (chronic obstructive pulmonary disease) (HCC)    Dysrhythmia    Family history of adverse reaction to anesthesia    his 2 daughters have N/V   Gout    Hypertension    Permanent atrial fibrillation (HCC)    Prediabetes    Past Surgical History:  Procedure Laterality Date   ATRIAL FIBRILLATION ABLATION     Social History:   reports that he has quit smoking. His smoking use included cigarettes. He has a 60 pack-year smoking history. He has never used smokeless tobacco. He reports current alcohol  use. He reports that he does not use drugs.  Family History  Problem Relation Age of Onset   Atrial fibrillation Neg Hx    Arrhythmia Neg Hx  Medications: Patient's Medications  New Prescriptions   No medications on file  Previous Medications   ALBUTEROL  (VENTOLIN  HFA) 108 (90 BASE) MCG/ACT INHALER    INHALE TWO PUFFS BY MOUTH INTO LUNGS EVERY 6 HOURS AS NEEDED FOR WHEEZING/SHORTNESS OF BREATH   ALLOPURINOL  (ZYLOPRIM ) 100 MG TABLET    Take 1 tablet (100 mg total) by mouth daily.   CHOLECALCIFEROL (VITAMIN D3) 25 MCG (1000 UNIT) TABLET    Take 1,000 Units by mouth daily.   DILTIAZEM  (CARDIZEM  CD) 120 MG 24 HR CAPSULE    Take 120 mg by mouth daily.   ELIQUIS  2.5 MG TABS TABLET    Take 2.5  mg by mouth 2 (two) times daily.   FLUTICASONE-UMECLIDIN-VILANT (TRELEGY ELLIPTA ) 200-62.5-25 MCG/ACT AEPB    INHALE 1 PUFF INTO THE LUNGS DAILY   IPRATROPIUM-ALBUTEROL  (DUONEB) 0.5-2.5 (3) MG/3ML SOLN    Take 3 mLs by nebulization every 6 (six) hours as needed.   LATANOPROST (XALATAN) 0.005 % OPHTHALMIC SOLUTION    Place 1 drop into both eyes at bedtime.   METOPROLOL  TARTRATE (LOPRESSOR ) 100 MG TABLET    Take 100 mg by mouth 2 (two) times daily.   MONTELUKAST  (SINGULAIR ) 10 MG TABLET    Take 10 mg by mouth daily.   PROBIOTIC PRODUCT (PROBIOTIC DAILY PO)    Take 220 mg by mouth daily. 60 billions probiotic 10 strains   TORSEMIDE  (DEMADEX ) 20 MG TABLET    Take 1 tablet (20 mg total) by mouth 2 (two) times daily.  Modified Medications   No medications on file  Discontinued Medications   BIOTIN 1000 MCG CHEW    Chew 1,000 mcg by mouth daily.    Physical Exam: Vitals:   02/12/24 0932  BP: 98/64  Pulse: (!) 54  Temp: (!) 97.3 F (36.3 C)  SpO2: (!) 80%  Height: 5' 8 (1.727 m)   Body mass index is 26.76 kg/m. BP Readings from Last 3 Encounters:  02/12/24 98/64  12/26/23 (!) 142/82  12/25/23 125/87   Wt Readings from Last 3 Encounters:  12/26/23 176 lb (79.8 kg)  12/25/23 167 lb (75.8 kg)  12/19/23 167 lb (75.8 kg)    Physical Exam Constitutional:      Appearance: Normal appearance.  HENT:     Head: Normocephalic and atraumatic.  Cardiovascular:     Rate and Rhythm: Tachycardia present.     Pulses: Normal pulses.     Heart sounds: Normal heart sounds.  Pulmonary:     Effort: No respiratory distress.     Breath sounds: No stridor. No wheezing or rales.     Comments: tachypnea Abdominal:     General: Bowel sounds are normal. There is no distension.     Palpations: Abdomen is soft.     Tenderness: There is no abdominal tenderness. There is no guarding.  Musculoskeletal:        General: Swelling present.     Comments: Left Knee - no redness,  Swollen + Tender to touch    Neurological:     Mental Status: He is alert. Mental status is at baseline.     Motor: No weakness.     Labs reviewed: Basic Metabolic Panel: Recent Labs    02/13/23 2058 04/29/23 1728 09/04/23 0000 09/25/23 0835  NA 136 134*  --  143  K 4.2 3.6  --  4.7  CL 98 94*  --  99  CO2 27 25  --  24  GLUCOSE 189* 107*  --  97  BUN 46* 70*  --  47*  CREATININE 2.50* 3.18*  --  2.52*  CALCIUM  9.6 8.0* 8.7 8.8   Liver Function Tests: Recent Labs    02/13/23 2058  AST 11*  ALT 8  ALKPHOS 91  BILITOT 1.5*  PROT 7.4  ALBUMIN 4.2   No results for input(s): LIPASE, AMYLASE in the last 8760 hours. No results for input(s): AMMONIA in the last 8760 hours. CBC: Recent Labs    02/13/23 2058 04/29/23 1728  WBC 15.0* 11.6*  NEUTROABS 12.3*  --   HGB 12.6* 12.3*  HCT 38.6* 38.5*  MCV 91.3 92.5  PLT 174 218   Lipid Panel: No results for input(s): CHOL, HDL, LDLCALC, TRIG, CHOLHDL, LDLDIRECT in the last 8760 hours. TSH: No results for input(s): TSH in the last 8760 hours. A1C: No results found for: HGBA1C  Assessment and Plan Assessment & Plan  1. Tachycardia (Primary)  SOB (shortness of breath)  Hypotension, unspecified hypotension type  Pt c/o SOB with rest Used albuterol  nebulizer treatments 15 times last night  He is able to speak in 2-3 word sentences Tachypnea+ Pt reports that his urine out put decreased  Tachycardic, hypotensive  Reports decreased urine out out  Will send the patient to Emergency room

## 2024-02-12 NOTE — Plan of Care (Signed)

## 2024-02-12 NOTE — ED Notes (Signed)
ECHO in room.

## 2024-02-12 NOTE — Progress Notes (Signed)
 Patient with history of permanent atrial fibrillation presenting with severe rapid ventricular rates between 150-170 sent in from his PCPs office.  He was noted to be severely hypoxic/cyanotic, hypotensive and started on high flow nasal cannula 7 L.  Performed bedside, EF severely reduced probably <20%.  Diltiazem stopped.  Started IV amiodarone.  Got stat echocardiogram and lactic acid.  Consulted advanced heart failure to take over patient's care.  Also discussed with patient's daughter.  He is DNR.  Would be amenable to PICC line and IV amiodarone.  Unclear whether he would be candidate for vasopressors.

## 2024-02-12 NOTE — Consult Note (Addendum)
 Advanced Heart Failure Team Consult Note   Primary Physician: Sherlynn Madden, MD Cardiologist:  Vinie JAYSON Maxcy, MD Reason for Consultation: Cardiogenic Shock HPI:    Cory Morris is seen today for evaluation of cardiogenic shock at the request of Dr. Seena.   83 y.o. male with history of permanent atrial fibrillation, CKD 4, COPD, kyphosis, PVD, lung cancer s/p radiation, RV failure, and HTN.  Follow with Dr. Maxcy for permanent atrial fibrillation. Has been on diltiazem for years, however has been struggling with increase peripheral edema in the past year, felt that diltiazem might have been contributing, but continued and torsemide  increased. Referred to [facility] for venous duplex which showed venous reflux in BLE.   Last echo 6/24: EF 50-55%, mod reduced RV with severe TR.   Patient reports that he has not been taking his medications regularly. Had been off metoprolol , diltazem, and eliquis for 2 days. Presented to [facility] from PCP office with tachycardia and hypotension with symptoms of shortness of breath despite albuterol  nebs. In ED vitals notable for BP 106/82 with HR in 160-170s. Initially required NRB, but de-escalted to [place]. EKG showed atrial fibrillation 171 bpm. CXR showed bilateral small pleural effusion. Cardiology consulted. Given IV diltazem, however switched to amio after bedside echo performed showing significantly reduced LV function. Formal echo ordered.   Long discussion with patient family about goals of care. Patient states that he does not want any heroic measures. Is amenable to medical management and optimization. Discussed possible need for ICU should he develop hypotension, he would be okay with this in the short term. Also discussed inotrope therapy to assist with diuresis, concerned that he may not wean off inotropes well, and at this point he would like to discuss hospice. At no point would he want any form of HD. Will consult Palliative care for patient and  family support. DNR/DNI placed in chart per patients wishes.  Lab in ED: Na 134, K 4.4, Mg, 2.4, CO2 17, BUN/Cr 73/3.21, BNP 3326, WBC nl, ical 1.00, LA 3.3  Echo: EF <20%, severely reduced RV, mod/sev RV  Home Medications Prior to Admission medications   Medication Sig Start Date End Date Taking? Authorizing Provider  albuterol  (VENTOLIN  HFA) 108 (90 Base) MCG/ACT inhaler INHALE TWO PUFFS BY MOUTH INTO LUNGS EVERY 6 HOURS AS NEEDED FOR WHEEZING/SHORTNESS OF BREATH 09/20/22   Mannam, Praveen, MD  allopurinol  (ZYLOPRIM ) 100 MG tablet Take 1 tablet (100 mg total) by mouth daily. 01/07/24   Sherlynn Madden, MD  cholecalciferol (VITAMIN D3) 25 MCG (1000 UNIT) tablet Take 1,000 Units by mouth daily.    [provider]  diltiazem (CARDIZEM CD) 120 MG 24 hr capsule Take 120 mg by mouth daily.    [provider]  ELIQUIS 2.5 MG TABS tablet Take 2.5 mg by mouth 2 (two) times daily. 09/24/21   [provider]  Fluticasone-Umeclidin-Vilant (TRELEGY ELLIPTA ) 200-62.5-25 MCG/ACT AEPB INHALE 1 PUFF INTO THE LUNGS DAILY 10/21/23   Hope Almarie ORN, NP  ipratropium-albuterol  (DUONEB) 0.5-2.5 (3) MG/3ML SOLN Take 3 mLs by nebulization every 6 (six) hours as needed. 12/21/22   Mannam, Praveen, MD  latanoprost (XALATAN) 0.005 % ophthalmic solution Place 1 drop into both eyes at bedtime.    [provider]  metoprolol  tartrate (LOPRESSOR ) 100 MG tablet Take 100 mg by mouth 2 (two) times daily.    [provider]  montelukast (SINGULAIR) 10 MG tablet Take 10 mg by mouth daily. 08/19/22   [provider]  Probiotic  Product (PROBIOTIC DAILY PO) Take 220 mg by mouth daily. 60 billions probiotic 10 strains    [provider]  torsemide  (DEMADEX ) 20 MG tablet Take 1 tablet (20 mg total) by mouth 2 (two) times daily. 09/13/23   Mona Vinie BROCKS, MD    Past Medical History: Past Medical History:  Diagnosis Date   Aortic atherosclerosis (HCC)    CKD  (chronic kidney disease) stage 4, GFR 15-29 ml/min (HCC)    COPD (chronic obstructive pulmonary disease) (HCC)    Dysrhythmia    Family history of adverse reaction to anesthesia    his 2 daughters have N/V   Gout    Hypertension    Permanent atrial fibrillation (HCC)    Prediabetes     Past Surgical History: Past Surgical History:  Procedure Laterality Date   ATRIAL FIBRILLATION ABLATION      Family History: Family History  Problem Relation Age of Onset   Atrial fibrillation Neg Hx    Arrhythmia Neg Hx     Social History: Social History   Socioeconomic History   Marital status: Married    Spouse name: Not on file   Number of children: Not on file   Years of education: Not on file   Highest education level: GED or equivalent  Occupational History   Not on file  Tobacco Use   Smoking status: Former    Current packs/day: 1.00    Average packs/day: 1 pack/day for 60.0 years (60.0 ttl pk-yrs)    Types: Cigarettes   Smokeless tobacco: Never   Tobacco comments:    quit in April 2021  Vaping Use   Vaping status: Never Used  Substance and Sexual Activity   Alcohol use: Yes    Comment: occasional   Drug use: Never   Sexual activity: Not on file  Other Topics Concern   Not on file  Social History Narrative   Not on file   Social Drivers of Health   Financial Resource Strain: Low Risk  (02/10/2024)   Overall Financial Resource Strain (CARDIA)    Difficulty of Paying Living Expenses: Not very hard  Food Insecurity: No Food Insecurity (02/10/2024)   Hunger Vital Sign    Worried About Running Out of Food in the Last Year: Never true    Ran Out of Food in the Last Year: Never true  Transportation Needs: No Transportation Needs (02/10/2024)   PRAPARE - Administrator, Civil Service (Medical): No    Lack of Transportation (Non-Medical): No  Physical Activity: Inactive (02/10/2024)   Exercise Vital Sign    Days of Exercise per Week: 0 days    Minutes of  Exercise per Session: Not on file  Stress: No Stress Concern Present (02/10/2024)   [employer] of Occupational Health - Occupational Stress Questionnaire    Feeling of Stress: Not at all  Social Connections: Socially Isolated (02/10/2024)   Social Connection and Isolation Panel    Frequency of Communication with Friends and Family: Never    Frequency of Social Gatherings with Friends and Family: Never    Attends Religious Services: Never    Database administrator or Organizations: No    Attends Engineer, structural: Not on file    Marital Status: Married    Allergies:  Allergies  Allergen Reactions   Ace Inhibitors Other (See Comments)    Unknown   Dronedarone Other (See Comments)    Unknown    Objective:    Vital Signs:  Temp:  [96.7 F (35.9 C)] 96.7 F (35.9 C) (07/02 1328) Pulse Rate:  [53-165] 150 (07/02 1300) Resp:  [16-25] 18 (07/02 1300) BP: (96-138)/(70-118) 138/118 (07/02 1300) SpO2:  [85 %-100 %] 100 % (07/02 1300)   Weight change: There were no vitals filed for this visit.  Intake/Output:  No intake or output data in the 24 hours ending 02/12/24 1331   Physical Exam    General: Chronically-ill appearing. No distress on Panacea Cardiac: JVP to jaw. S1 and S2 present. No murmurs or rub. Extremities: Cool and dry.  2+ BLE edema.  Neuro: Alert and oriented x3. Affect pleasant. Moves all extremities without difficulty.  Telemetry   AF in 140s (personally reviewed)  EKG    As above  Labs   Basic Metabolic Panel: Recent Labs  Lab 02/12/24 1107 02/12/24 1112 02/12/24 1237  NA 134* 135  --   K 4.4 4.6  --   CL 98  --   --   CO2 17*  --   --   GLUCOSE 120*  --   --   BUN 73*  --   --   CREATININE 3.20*  --   --   CALCIUM 9.4  --   --   MG  --   --  2.4   Liver Function Tests: Recent Labs  Lab 02/12/24 1107  AST 29  ALT 47*  ALKPHOS 81  BILITOT 1.3*  PROT 7.8  ALBUMIN 3.6   CBC: Recent Labs  Lab 02/12/24 1107  02/12/24 1112  WBC 9.2  --   NEUTROABS 8.0*  --   HGB 13.2 15.0  HCT 44.2 44.0  MCV 89.5  --   PLT 231  --    BNP (last 3 results) Recent Labs    02/12/24 1107  BNP 3,326.2*   Coagulation Studies: Recent Labs    02/12/24 1108  LABPROT 16.3*  INR 1.2   Imaging   US  EKG SITE RITE Result Date: 02/12/2024 If Site Rite image not attached, placement could not be confirmed due to current cardiac rhythm.  DG Chest Portable 1 View Result Date: 02/12/2024 CLINICAL DATA:  SOB. Eval for pneumonia/pulm edema EXAM: PORTABLE CHEST - 1 VIEW COMPARISON:  September 13, 2021, January 16, 2024 FINDINGS: Small to moderate right pleural effusion. Small left pleural effusion. Peripheral and bibasilar predominant interstitial opacities. Patchy opacities in the lung bases. No pneumothorax. Mild cardiomegaly. Aortic atherosclerosis. No acute fracture or destructive lesions. Multilevel thoracic osteophytosis. IMPRESSION: Small to moderate right pleural effusion with small left pleural effusion. Patchy opacities in both lung bases, likely atelectasis. Alternatively, superimposed bronchopneumonia or aspiration could have this appearance in the correct clinical context. Electronically Signed   By: Rogelia Myers M.D.   On: 02/12/2024 11:56   Medications:    Current Medications:  budesonide-glycopyrrolate-formoterol  2 puff Inhalation BID   [START ON 02/13/2024] montelukast  10 mg Oral QHS   sodium chloride  flush  3 mL Intravenous Q12H   Infusions:  amiodarone 60 mg/hr (02/12/24 1258)   Followed by   amiodarone     Patient Profile   83 y.o. male with history of permanent atrial fibrillation, CKD 4, COPD, kyphosis, PVD, lung cancer, RV failure, and HTN.  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 Concerned that he may not wean off inotropes well, and at this point he would like to discuss hospice. Will consult palliative care for patient and family support.   Atrial Fibrillation with RVR - permanent - presenting with VR in 160-170s - initially given IV dilt in ED, however POCUS with severely reduced EF; stopped - given IV amio bolus + gtt at 60/hr - restart eliquis 2.5 mg bid  Severe TR - mod in 2024. Now severe - in the setting of RV failure and hypervolemia  AKI on CKD4 - Cr 3.2 on admission, not far from baseline - start diuresis once milrinone on - follow BMP with diuresis - would not want any form of dialysis  Length of Stay: 0  [place] Lee, NP  02/12/2024, 1:31 PM  Advanced Heart Failure Team Pager [phone] (M-F; 7a - 5p)  Please contact [facility] for night-coverage after hours (4p -7a ) and weekends on [email or web address]  Patient seen with NP, I formulated the plan and agree with the above note.   Patient has history of permanent AF, RV failure, lung cancer s/p radiation therapy and COPD, gout, CKD stage IV. He lives at [facility].  He took prednisone  for several days recently for gout, became nauseated and stopped his diltiazem CD, metoprolol , and Eliquis. He was sent to the ER from PCP's office for tachycardia, hypotension, and dyspnea.    In the ER, he was in AF with HR 150s and hypoxemic.  BP stable.  He was initially started on diltiazem gtt which was stopped when echo  was done showing EF < 20%, moderate RV enlargement with severe RV systolic dysfunction, moderate-severe TR, dilated IVC.  Lactate elevated at 3.3, BNP 3326, creatinine up to 3.2 from last creatinine 2.5.   General: NAD, kyphotic Neck: JVP 16+, no thyromegaly or thyroid nodule.  Lungs: Clear to auscultation bilaterally with normal respiratory effort. CV: Nondisplaced PMI.  Heart irregular S1/S2, no S3/S4, 2/6 HSM LLSB.  1+ chronic edema to knees.  No carotid bruit.  Unable to palpate pedal pulses.  Abdomen: Soft, nontender, no hepatosplenomegaly, no distention.  Skin: Intact without lesions or rashes.  Neurologic: Alert and oriented x 3.  Psych: Normal affect. Extremities: No clubbing or cyanosis.  HEENT: Normal.   1. Acute systolic CHF: Echo in 6/24 showed EF 50-55%, moderate RV enlargement/moderate RV dysfunction, IVC dilated.  This admission, patient presents in AF with RVR with elevated lactate.  Echo shows worsening especially of LV function with EF < 20%, moderate RV enlargement with severe RV systolic dysfunction, moderate-severe TR, dilated IVC. 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  He is markedly volume overloaded on exam with evidence for low output HF by lactate 3.3. - Control rate with amiodarone gtt 60 mg/hr.  - Place PICC to follow CVP and co-ox.  He is a poor HD candidate and clearly states that he would not want dialysis if  renal function were to worsen.  - Will start milrinone 0.25 mcg/kg/min.  - Lasix 80 mg IV x 1 then start 15 mg/hr gtt.  - Stop metoprolol  and diltiazem with marked volume overload and low output HF.  - Not a candidate for advanced therapies with age, frailty, AKI on CKD stage IV.  2. Atrial fibrillation: This has been deemed permanent.  Had been off metoprolol , diltiazem, and Eliquis for several days due to nausea and admitted with AF/RVR 150s.  - Start amiodarone  gtt 60 mg/hr for rate control.  - Restart Eliquis 2.5 bid.  3. AKI on CKD stage 4: Baseline creatinine seems to be around 2.5, up to 3.2 today in setting of low output HF with cardiorenal syndrome.  Patient would be poor HD candidate and would not want it under any circumstances.  - Adding milrinone as above and diuresing, follow creatinine with these steps.  4. Code status: DNR/DNI.  He would not want HD.  He is not a candidate for advances therapies.  At baseline, lives in assisted living.    Ezra Shuck 02/12/2024 2:57 PM

## 2024-02-13 ENCOUNTER — Encounter: Payer: Self-pay | Admitting: Sports Medicine

## 2024-02-13 ENCOUNTER — Inpatient Hospital Stay (HOSPITAL_COMMUNITY)

## 2024-02-13 DIAGNOSIS — I5033 Acute on chronic diastolic (congestive) heart failure: Secondary | ICD-10-CM

## 2024-02-13 DIAGNOSIS — I4891 Unspecified atrial fibrillation: Secondary | ICD-10-CM | POA: Diagnosis not present

## 2024-02-13 DIAGNOSIS — I5082 Biventricular heart failure: Secondary | ICD-10-CM | POA: Diagnosis not present

## 2024-02-13 DIAGNOSIS — R570 Cardiogenic shock: Secondary | ICD-10-CM | POA: Diagnosis not present

## 2024-02-13 DIAGNOSIS — J9601 Acute respiratory failure with hypoxia: Secondary | ICD-10-CM | POA: Diagnosis not present

## 2024-02-13 DIAGNOSIS — I5021 Acute systolic (congestive) heart failure: Secondary | ICD-10-CM | POA: Diagnosis not present

## 2024-02-13 LAB — CBC
HCT: 36.6 % — ABNORMAL LOW (ref 39.0–52.0)
Hemoglobin: 11.5 g/dL — ABNORMAL LOW (ref 13.0–17.0)
MCH: 27.1 pg (ref 26.0–34.0)
MCHC: 31.4 g/dL (ref 30.0–36.0)
MCV: 86.1 fL (ref 80.0–100.0)
Platelets: 193 10*3/uL (ref 150–400)
RBC: 4.25 MIL/uL (ref 4.22–5.81)
RDW: 16.8 % — ABNORMAL HIGH (ref 11.5–15.5)
WBC: 8.7 10*3/uL (ref 4.0–10.5)
nRBC: 0 % (ref 0.0–0.2)

## 2024-02-13 LAB — ECHOCARDIOGRAM COMPLETE
Est EF: <20
Height: 67 in
S' Lateral: 4.6 cm
Weight: 2694.9 [oz_av]

## 2024-02-13 LAB — MAGNESIUM: Magnesium: 2.2 mg/dL (ref 1.7–2.4)

## 2024-02-13 LAB — COMPREHENSIVE METABOLIC PANEL WITH GFR
ALT: 41 U/L (ref 0–44)
AST: 31 U/L (ref 15–41)
Albumin: 2.9 g/dL — ABNORMAL LOW (ref 3.5–5.0)
Alkaline Phosphatase: 63 U/L (ref 38–126)
Anion gap: 14 (ref 5–15)
BUN: 76 mg/dL — ABNORMAL HIGH (ref 8–23)
CO2: 22 mmol/L (ref 22–32)
Calcium: 8.8 mg/dL — ABNORMAL LOW (ref 8.9–10.3)
Chloride: 99 mmol/L (ref 98–111)
Creatinine, Ser: 3.29 mg/dL — ABNORMAL HIGH (ref 0.61–1.24)
GFR, Estimated: 18 mL/min — ABNORMAL LOW (ref >60)
Glucose, Bld: 125 mg/dL — ABNORMAL HIGH (ref 70–99)
Potassium: 4 mmol/L (ref 3.5–5.1)
Sodium: 135 mmol/L (ref 135–145)
Total Bilirubin: 1 mg/dL (ref 0.0–1.2)
Total Protein: 6.1 g/dL — ABNORMAL LOW (ref 6.5–8.1)

## 2024-02-13 LAB — COOXEMETRY PANEL
Carboxyhemoglobin: 1.7 % — ABNORMAL HIGH (ref 0.5–1.5)
Methemoglobin: <0.7 % (ref 0.0–1.5)
O2 Saturation: 61.7 %
Total hemoglobin: 11.4 g/dL — ABNORMAL LOW (ref 12.0–16.0)

## 2024-02-13 LAB — URIC ACID: Uric Acid, Serum: 15.6 mg/dL — ABNORMAL HIGH (ref 3.7–8.6)

## 2024-02-13 LAB — LACTIC ACID, PLASMA: Lactic Acid, Venous: 1.5 mmol/L (ref 0.5–1.9)

## 2024-02-13 MED ORDER — PREDNISONE 20 MG PO TABS
40.0000 mg | ORAL_TABLET | Freq: Every day | ORAL | Status: DC
  Administered 2024-02-13 – 2024-02-14 (×2): 40 mg via ORAL
  Filled 2024-02-13 (×2): qty 2

## 2024-02-13 MED ORDER — METOLAZONE 2.5 MG PO TABS
5.0000 mg | ORAL_TABLET | Freq: Once | ORAL | Status: AC
  Administered 2024-02-13: 5 mg via ORAL
  Filled 2024-02-13: qty 2

## 2024-02-13 MED ORDER — ONDANSETRON HCL 4 MG/2ML IJ SOLN
4.0000 mg | Freq: Four times a day (QID) | INTRAMUSCULAR | Status: DC | PRN
  Administered 2024-02-13 – 2024-02-14 (×2): 4 mg via INTRAVENOUS
  Filled 2024-02-13 (×2): qty 2

## 2024-02-13 MED ORDER — MIDODRINE HCL 5 MG PO TABS: 10.0000 mg | ORAL_TABLET | Freq: Three times a day (TID) | ORAL | Status: DC

## 2024-02-13 MED ORDER — MIDODRINE HCL 5 MG PO TABS
15.0000 mg | ORAL_TABLET | Freq: Three times a day (TID) | ORAL | Status: DC
  Administered 2024-02-13 (×2): 15 mg via ORAL
  Filled 2024-02-13 (×3): qty 3

## 2024-02-13 MED ORDER — METOLAZONE 2.5 MG PO TABS
5.0000 mg | ORAL_TABLET | Freq: Two times a day (BID) | ORAL | Status: DC
  Administered 2024-02-13: 5 mg via ORAL
  Filled 2024-02-13: qty 2

## 2024-02-13 MED ORDER — PERFLUTREN LIPID MICROSPHERE
1.0000 mL | INTRAVENOUS | Status: AC | PRN
  Administered 2024-02-13: 5 mL via INTRAVENOUS

## 2024-02-13 MED ORDER — AMIODARONE LOAD VIA INFUSION
150.0000 mg | Freq: Once | INTRAVENOUS | Status: AC
  Administered 2024-02-13: 150 mg via INTRAVENOUS
  Filled 2024-02-13: qty 83.34

## 2024-02-13 MED ORDER — AMIODARONE IV BOLUS ONLY 150 MG/100ML: 150.0000 mg | Freq: Once | INTRAVENOUS | Status: DC

## 2024-02-13 MED ORDER — FUROSEMIDE 10 MG/ML IJ SOLN
80.0000 mg | Freq: Once | INTRAMUSCULAR | Status: AC
  Administered 2024-02-13: 80 mg via INTRAVENOUS
  Filled 2024-02-13: qty 8

## 2024-02-13 NOTE — Progress Notes (Signed)
 HR climbing to 130-140's, Amiodarone infusing at 60 mg/hr, milrione and lasix infusing.  BP 100/68.  Dr. Kingston notified.  No orders given.  Will continue with current plan of care.

## 2024-02-13 NOTE — Telephone Encounter (Signed)
Message routed to PCP Venita Sheffield, MD

## 2024-02-13 NOTE — Progress Notes (Addendum)
 PROGRESS NOTE    Cory Morris  FMW:[id number] DOB: 1941/04/27 DOA: 02/12/2024 PCP: Sherlynn Madden, MD  83/M chronically ill with permanent A-fib, CKD 4, lung cancer sp XRT, COPD, chronic diastolic CHF, RV failure, hypertension presented to the ED ED 7/2 from PCPs office with hypotension tachycardia and shortness of breath.  In the ER he was noted to be in A-fib RVR, chest x-ray noted bilateral pleural effusion, hypoxic requiring nonrebreather mask, repeat echo with severe biventricular failure and creatinine of 3.2, lactic acid 3.3, BNP 3326 - Admitted, heart failure team consulting placed on milrinone and diuretics  Subjective: -Feels a little better today, breathing is improving  Assessment and Plan:  Acute biventricular failure - Known RV failure, echo this admission with a EF less than 20%, severely reduced RV, severe TR - Presented in cardiogenic shock, lactic acidosis - Heart failure team following, currently on high-dose IV Lasix and milrinone, starting midodrine - Palliative care consult, prognosis appears very poor  Atrial fibrillation with RVR - Currently on amiodarone gtt., Eliquis  AKI on CKD 4 - Baseline creatinine is 2.5-3 - Creatinine slightly higher in the setting of cardiogenic shock, BiV failure - Followed by palliative care, would not want to pursue dialysis  COPD/chronic respiratory failure - Trelegy changed to Breztri, continue Singulair  History of lung cancer -sp XRT  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 - Add prednisone   Goals: Chronically ill debilitated 83/M with history of COPD, lung cancer, permanent A-fib, CKD 4 now with severe biventricular failure, cardiogenic shock, prognosis is poor, he is followed by palliative care as outpatient, inpatient consult requested  DVT prophylaxis: Eliquis Code Status: DNR Family Communication: Daughter at bedside Disposition Plan:   Consultants:    Procedures:   Antimicrobials:     Objective: Vitals:   02/13/24 0200 02/13/24 0410 02/13/24 0448 02/13/24 0800  BP: 97/74 96/72 100/72 92/60  Pulse: (!) 121 (!) 126 (!) 136 (!) 132  Resp: (!) 26 (!) 25 20 (!) 28  Temp:   98.4 F (36.9 C) 98 F (36.7 C)  TempSrc:   Oral Oral  SpO2: 96% 95% 97% 99%  Weight:      Height:        Intake/Output Summary (Last 24 hours) at 02/13/2024 0956 Last data filed at 02/13/2024 0951 Gross per 24 hour  Intake 619.32 ml  Output 400 ml  Net 219.32 ml   Filed Weights   02/12/24 1330 02/12/24 1624  Weight: 79 kg 76.4 kg    Examination:  General exam: Appears calm and comfortable  Respiratory system: Clear to auscultation Cardiovascular system: S1 & S2 heard, RRR.  Abd: nondistended, soft and nontender.Normal bowel sounds heard. Central nervous system: Alert and oriented. No focal neurological deficits. Extremities: no edema Skin: No rashes Psychiatry:  Mood & affect appropriate.     Data Reviewed:   CBC: Recent Labs  Lab 02/12/24 1107 02/12/24 1112 02/13/24 0223  WBC 9.2  --  8.7  NEUTROABS 8.0*  --   --   HGB 13.2 15.0 11.5*  HCT 44.2 44.0 36.6*  MCV 89.5  --  86.1  PLT 231  --  193   Basic Metabolic Panel: Recent Labs  Lab 02/12/24 1107 02/12/24 1112 02/12/24 1237 02/13/24 0223  NA 134* 135  --  135  K 4.4 4.6  --  4.0  CL 98  --   --  99  CO2 17*  --   --  22  GLUCOSE 120*  --   --  125*  BUN 73*  --   --  76*  CREATININE 3.20*  --   --  3.29*  CALCIUM 9.4  --   --  8.8*  MG  --   --  2.4 2.2   GFR: Estimated Creatinine Clearance: 15.9 mL/min (A) (by C-G formula based on SCr of 3.29 mg/dL (H)). Liver Function Tests: Recent Labs  Lab 02/12/24 1107 02/13/24 0223  AST 29 31  ALT 47* 41  ALKPHOS 81 63  BILITOT 1.3* 1.0  PROT 7.8 6.1*  ALBUMIN 3.6 2.9*   No results for input(s): LIPASE, AMYLASE in the last 168 hours. No results for input(s): AMMONIA in the last 168 hours. Coagulation Profile: Recent Labs  Lab 02/12/24 1108   INR 1.2   Cardiac Enzymes: No results for input(s): CKTOTAL, CKMB, CKMBINDEX, TROPONINI in the last 168 hours. BNP (last 3 results) No results for input(s): PROBNP in the last 8760 hours. HbA1C: No results for input(s): HGBA1C in the last 72 hours. CBG: No results for input(s): GLUCAP in the last 168 hours. Lipid Profile: No results for input(s): CHOL, HDL, LDLCALC, TRIG, CHOLHDL, LDLDIRECT in the last 72 hours. Thyroid Function Tests: Recent Labs    02/12/24 1524  TSH 1.139   Anemia Panel: No results for input(s): VITAMINB12, FOLATE, FERRITIN, TIBC, IRON, RETICCTPCT in the last 72 hours. Urine analysis:    Component Value Date/Time   COLORURINE YELLOW 04/29/2023 1634   APPEARANCEUR CLEAR 04/29/2023 1634   LABSPEC 1.009 04/29/2023 1634   PHURINE 5.0 04/29/2023 1634   GLUCOSEU NEGATIVE 04/29/2023 1634   HGBUR MODERATE (A) 04/29/2023 1634   BILIRUBINUR NEGATIVE 04/29/2023 1634   KETONESUR NEGATIVE 04/29/2023 1634   PROTEINUR 30 (A) 04/29/2023 1634   NITRITE NEGATIVE 04/29/2023 1634   LEUKOCYTESUR NEGATIVE 04/29/2023 1634   Sepsis Labs: @LABRCNTIP (procalcitonin:4,lacticidven:4)  ) Recent Results (from the past 240 hours)  MRSA Next Gen by PCR, Nasal     Status: None   Collection Time: 02/12/24  7:01 PM   Specimen: Nasal Mucosa; Nasal Swab  Result Value Ref Range Status   MRSA by PCR Next Gen NOT DETECTED NOT DETECTED Final    Comment: (NOTE) The GeneXpert MRSA Assay (FDA approved for NASAL specimens only), is one component of a comprehensive MRSA colonization surveillance program. It is not intended to diagnose MRSA infection nor to guide or monitor treatment for MRSA infections. Test performance is not FDA approved in patients less than 21 years old. Performed at [facility], [address] [place], [place] [address]      Radiology Studies: DG Chest Port 1 View Result Date: 02/12/2024 CLINICAL DATA:  Status post  PICC placement. EXAM: PORTABLE CHEST 1 VIEW COMPARISON:  Earlier radiograph dated 02/12/2024. FINDINGS: Interval placement of a right-sided PICC with tip over central SVC. Similar appearance of bilateral pleural effusions and associated lung densities. No pneumothorax. Stable cardiac silhouette. No acute osseous pathology. IMPRESSION: Right-sided PICC with tip over central SVC.  No pneumothorax. Electronically Signed   By: Vanetta Chou M.D.   On: 02/12/2024 19:08   ECHOCARDIOGRAM COMPLETE Result Date: 02/12/2024    ECHOCARDIOGRAM REPORT   Patient Name:   SHEPPARD LUCKENBACH Date of Exam: 02/12/2024 Medical Rec #:  [id number]        Height:       68.0 in Accession #:    [id number]       Weight:       176.0 lb Date of Birth:  08-17-1940  BSA:          1.936 m Patient Age:    82 years         BP:           138/118 mmHg Patient Gender: M                HR:           140 bpm. Exam Location:  Inpatient Procedure: 2D Echo (Both Spectral and Color Flow Doppler were utilized during            procedure). STAT ECHO Indications:    CAD of native vessel  History:        Patient has no prior history of Echocardiogram examinations.                 COPD and chronic kidney disease, Arrythmias:Atrial Fibrillation;                 Risk Factors:Hypertension and Dyslipidemia.  Sonographer:    Tinnie Barefoot RDCS Referring Phys: [id number] TATUM N LEMLY IMPRESSIONS  1. Left ventricular ejection fraction, by estimation, is <20%. The left ventricle has severely decreased function. The left ventricle demonstrates global hypokinesis. Left ventricular diastolic parameters are indeterminate.  2. Right ventricular systolic function is severely reduced. The right ventricular size is moderately enlarged. There is normal pulmonary artery systolic pressure. The estimated right ventricular systolic pressure is 30.7 mmHg.  3. Left atrial size was mildly dilated.  4. Right atrial size was mildly dilated.  5. The mitral valve is normal in  structure. Trivial mitral valve regurgitation. No evidence of mitral stenosis.  6. The tricuspid valve is abnormal. Tricuspid valve regurgitation is moderate to severe, possibly severe given hepatic vein systolic doppler flow reversal.  7. The aortic valve is tricuspid. There is mild calcification of the aortic valve. Aortic valve regurgitation is trivial. No aortic stenosis is present.  8. The inferior vena cava is dilated in size with <50% respiratory variability, suggesting right atrial pressure of 15 mmHg. FINDINGS  Left Ventricle: Left ventricular ejection fraction, by estimation, is <20%. The left ventricle has severely decreased function. The left ventricle demonstrates global hypokinesis. The left ventricular internal cavity size was normal in size. There is no  left ventricular hypertrophy. Left ventricular diastolic parameters are indeterminate. Right Ventricle: The right ventricular size is moderately enlarged. No increase in right ventricular wall thickness. Right ventricular systolic function is severely reduced. There is normal pulmonary artery systolic pressure. The tricuspid regurgitant velocity is 1.98 m/s, and with an assumed right atrial pressure of 15 mmHg, the estimated right ventricular systolic pressure is 30.7 mmHg. Left Atrium: Left atrial size was mildly dilated. Right Atrium: Right atrial size was mildly dilated. Pericardium: Trivial pericardial effusion is present. Mitral Valve: The mitral valve is normal in structure. Mild to moderate mitral annular calcification. Trivial mitral valve regurgitation. No evidence of mitral valve stenosis. Tricuspid Valve: The tricuspid valve is abnormal. Tricuspid valve regurgitation is moderate to severe. Aortic Valve: The aortic valve is tricuspid. There is mild calcification of the aortic valve. Aortic valve regurgitation is trivial. No aortic stenosis is present. Pulmonic Valve: The pulmonic valve was normal in structure. Pulmonic valve regurgitation  is not visualized. Aorta: The aortic root is normal in size and structure. Venous: The inferior vena cava is dilated in size with less than 50% respiratory variability, suggesting right atrial pressure of 15 mmHg. IAS/Shunts: No atrial level shunt detected by color flow Doppler.  LEFT VENTRICLE  PLAX 2D LVIDd:         4.60 cm LVIDs:         4.10 cm LV PW:         0.90 cm LV IVS:        0.80 cm LVOT diam:     1.90 cm LVOT Area:     2.84 cm  RIGHT VENTRICLE          IVC RV Basal diam:  3.20 cm  IVC diam: 2.40 cm LEFT ATRIUM             Index        RIGHT ATRIUM           Index LA diam:        3.80 cm 1.96 cm/m   RA Area:     19.40 cm LA Vol (A2C):   62.4 ml 32.24 ml/m  RA Volume:   63.10 ml  32.60 ml/m LA Vol (A4C):   71.5 ml 36.94 ml/m LA Biplane Vol: 67.4 ml 34.82 ml/m   AORTA Ao Root diam: 3.40 cm TRICUSPID VALVE TR Peak grad:   15.7 mmHg TR Vmax:        198.00 cm/s  SHUNTS Systemic Diam: 1.90 cm Dalton McleanMD Electronically signed by Ezra Kanner Signature Date/Time: 02/12/2024/1:46:17 PM    Final    US  EKG SITE RITE Result Date: 02/12/2024 If Site Rite image not attached, placement could not be confirmed due to current cardiac rhythm.  DG Chest Portable 1 View Result Date: 02/12/2024 CLINICAL DATA:  SOB. Eval for pneumonia/pulm edema EXAM: PORTABLE CHEST - 1 VIEW COMPARISON:  September 13, 2021, January 16, 2024 FINDINGS: Small to moderate right pleural effusion. Small left pleural effusion. Peripheral and bibasilar predominant interstitial opacities. Patchy opacities in the lung bases. No pneumothorax. Mild cardiomegaly. Aortic atherosclerosis. No acute fracture or destructive lesions. Multilevel thoracic osteophytosis. IMPRESSION: Small to moderate right pleural effusion with small left pleural effusion. Patchy opacities in both lung bases, likely atelectasis. Alternatively, superimposed bronchopneumonia or aspiration could have this appearance in the correct clinical context. Electronically Signed   By:  Rogelia Myers M.D.   On: 02/12/2024 11:56     Scheduled Meds:  apixaban  2.5 mg Oral BID   budesonide-glycopyrrolate-formoterol  2 puff Inhalation BID   Chlorhexidine  Gluconate Cloth  6 each Topical Daily   Gerhardt's butt cream   Topical TID   midodrine  10 mg Oral TID WC   montelukast  10 mg Oral QHS   predniSONE   40 mg Oral Q breakfast   sodium chloride  flush  10-40 mL Intracatheter Q12H   sodium chloride  flush  3 mL Intravenous Q12H   Continuous Infusions:  amiodarone 60 mg/hr (02/13/24 0749)   furosemide (LASIX) 200 mg in dextrose 5 % 100 mL (2 mg/mL) infusion 30 mg/hr (02/13/24 0759)   milrinone 0.25 mcg/kg/min (02/13/24 0932)     LOS: 1 day    Time spent:    Sigurd Pac, MD [facility]   02/13/2024, 9:56 AM

## 2024-02-13 NOTE — Plan of Care (Signed)

## 2024-02-13 NOTE — TOC Initial Note (Signed)
 Transition of Care [facility]) - Initial/Assessment Note    Patient Details  Name: Cory Morris MRN: [id number] Date of Birth: [birth year]  [facility] CM/SW Contact:    Justina Delcia Czar, RN Phone Number: [phone] 02/13/2024, 3:30 PM  Clinical Narrative:                 Spoke to pt, wife and dtr at bedside. Dtr is caregiver in home for pt and mother.  Active with [facility]. Has RW and wheelchair at home. Pt may need oxygen for home. Active with [facility] for Outpatient Palliative Services.  Will continue to follow for dc needs.   Expected Discharge Plan: Home w Home Health Services Barriers to Discharge: Continued Medical Work up   Patient Goals and CMS Choice Patient states their goals for this hospitalization and ongoing recovery are:: would like to get better CMS Medicare.gov Compare Post Acute Care list provided to:: Patient Represenative (must comment) Choice offered to / list presented to : Spouse      Expected Discharge Plan and Services   Discharge Planning Services: CM Consult Post Acute Care Choice: Home Health Living arrangements for the past 2 months: Single Family Home                           HH Arranged: RN, PT HH Agency: [facility]        Prior Living Arrangements/Services Living arrangements for the past 2 months: Single Family Home Lives with:: Spouse, Adult Children Patient language and need for interpreter reviewed:: Yes Do you feel safe going back to the place where you live?: Yes      Need for Family Participation in Patient Care: Yes (Comment) Care giver support system in place?: Yes (comment) Current home services: DME (wheelchair, rolling walker, grab bars, shower stool) Criminal Activity/Legal Involvement Pertinent to Current Situation/Hospitalization: No - Comment as needed  Activities of Daily Living   ADL Screening (condition at time of admission) Independently performs  ADLs?: No Does the patient have a NEW difficulty with bathing/dressing/toileting/self-feeding that is expected to last >3 days?: No Does the patient have a NEW difficulty with getting in/out of bed, walking, or climbing stairs that is expected to last >3 days?: No Does the patient have a NEW difficulty with communication that is expected to last >3 days?: No Is the patient deaf or have difficulty hearing?: No Does the patient have difficulty seeing, even when wearing glasses/contacts?: No Does the patient have difficulty concentrating, remembering, or making decisions?: No  Permission Sought/Granted Permission sought to share information with : Case Manager, Family Supports, PCP Permission granted to share information with : Yes, Verbal Permission Granted  Share Information with NAME: Macario Ellen  Permission granted to share info w AGENCY: Home Health, DME, PCP  Permission granted to share info w Relationship: daughter  Permission granted to share info w Contact Information: [phone]  Emotional Assessment Appearance:: Appears stated age   Affect (typically observed): Appropriate Orientation: : Oriented to Self, Oriented to Place, Oriented to  Time, Oriented to Situation   Psych Involvement: No (comment)  Admission diagnosis:  Hypoxia [R09.02] Chronic atrial fibrillation (HCC) [I48.20] Atrial fibrillation with rapid ventricular response (HCC) [I48.91] Acute respiratory failure with hypoxia (HCC) [J96.01] Congestive heart failure, unspecified HF chronicity, unspecified heart failure type [facility]) [I50.9] Patient Active Problem List   Diagnosis Date Noted   Acute respiratory failure with hypoxia (HCC) 02/12/2024  Atrial fibrillation with RVR (HCC) 02/12/2024   Acute on chronic diastolic CHF (congestive heart failure) (HCC) 02/12/2024   Acute renal failure superimposed on stage 4 chronic kidney disease (HCC) 02/12/2024   Peripheral arterial disease (HCC) 07/19/2023   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) 03/13/2023   Lung nodule 01/31/2023   Idiopathic pulmonary fibrosis (HCC) 11/23/2022   Olecranon bursitis, left elbow 08/14/2022   Anemia of renal disease 08/09/2022   Hyperparathyroidism due to renal insufficiency (HCC) 08/09/2022   Malignant neoplasm of lung (HCC) 02/28/2022   Chronic atrial fibrillation (HCC) 05/29/2021   Chronic kidney disease, stage 4 (severe) (HCC) 05/29/2021   Chronic obstructive lung disease (HCC) 05/29/2021   Essential hypertension 05/29/2021   Gout 05/29/2021   Cough 05/29/2021   Heart failure (HCC) 05/29/2021   Hardening of the aorta (main artery of the heart) (HCC) 05/29/2021   Hyperlipidemia 05/29/2021   Prediabetes 05/29/2021   Recurrent major depression in remission (HCC) 05/29/2021   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 [place] [address] Phone: [phone] Fax: [phone]     Social Drivers of Health (SDOH) Social History: SDOH Screenings   Food Insecurity: No Food Insecurity (02/12/2024)  Housing: Low Risk  (02/12/2024)  Transportation Needs: No Transportation Needs (02/12/2024)  Utilities: Not At Risk (02/12/2024)  Alcohol Screen: Low Risk  (02/10/2024)  Depression (PHQ2-9): Low Risk  (12/17/2023)  Financial Resource Strain: Low Risk  (02/10/2024)  Physical Activity: Inactive (02/10/2024)  Social Connections: Moderately Integrated (02/12/2024)  Recent Concern: Social Connections - Socially Isolated (02/10/2024)  Stress: No Stress Concern Present (02/10/2024)  Tobacco Use: Medium Risk (02/12/2024)   SDOH Interventions:     Readmission Risk Interventions     No data to display

## 2024-02-13 NOTE — Progress Notes (Addendum)
 Advanced Heart Failure Rounding Note  Cardiologist: Vinie JAYSON Maxcy, MD  Chief Complaint:  Subjective:    Coox 62% and CVP 19 on Milrinone 0.25 mcg/kg/min. LA 3.7>1.5 BP soft overnight. HR remains in 130s on amio gtt.  Minimal UOP 200cc documented on Lasix 15/hr. Cr 3.2>3.29  Dosing on exam. SOB improved. Now on room air.   Objective:    Weight Range: 76.4 kg Body mass index is 26.38 kg/m.   Vital Signs:   Temp:  [95.6 F (35.3 C)-98.6 F (37 C)] 98.4 F (36.9 C) (07/03 0448) Pulse Rate:  [53-165] 136 (07/03 0448) Resp:  [14-33] 20 (07/03 0448) BP: (88-138)/(54-118) 100/72 (07/03 0448) SpO2:  [85 %-100 %] 97 % (07/03 0448) Weight:  [76.4 kg-79 kg] 76.4 kg (07/02 1624) Last BM Date : 02/11/24  Weight change: Filed Weights   02/12/24 1330 02/12/24 1624  Weight: 79 kg 76.4 kg   Intake/Output:  Intake/Output Summary (Last 24 hours) at 02/13/2024 0654 Last data filed at 02/13/2024 0450 Gross per 24 hour  Intake 318.18 ml  Output 200 ml  Net 118.18 ml    Physical Exam    CVP 19 General: chronically-ill appearing. No distress on RA Cardiac: JVP to jaw. S1 and S2 present. No murmurs or rub. Extremities: Warm and dry.  2+ BLE edema.  Neuro: Alert and oriented x3. Affect pleasant. Moves all extremities without difficulty. Lines/Devices: RUE PICC  Telemetry   AF in 130s (personally reviewed)  EKG    No new EKG to review  Labs    CBC Recent Labs    02/12/24 1107 02/12/24 1112 02/13/24 0223  WBC 9.2  --  8.7  NEUTROABS 8.0*  --   --   HGB 13.2 15.0 11.5*  HCT 44.2 44.0 36.6*  MCV 89.5  --  86.1  PLT 231  --  193   Basic Metabolic Panel Recent Labs    92/97/74 1107 02/12/24 1112 02/12/24 1237 02/13/24 0223  NA 134* 135  --  135  K 4.4 4.6  --  4.0  CL 98  --   --  99  CO2 17*  --   --  22  GLUCOSE 120*  --   --  125*  BUN 73*  --   --  76*  CREATININE 3.20*  --   --  3.29*  CALCIUM 9.4  --   --  8.8*  MG  --   --  2.4 2.2   Liver  Function Tests Recent Labs    02/12/24 1107 02/13/24 0223  AST 29 31  ALT 47* 41  ALKPHOS 81 63  BILITOT 1.3* 1.0  PROT 7.8 6.1*  ALBUMIN 3.6 2.9*   BNP (last 3 results) Recent Labs    02/12/24 1107  BNP 3,326.2*   Medications:    Scheduled Medications:  apixaban  2.5 mg Oral BID   budesonide-glycopyrrolate-formoterol  2 puff Inhalation BID   Chlorhexidine  Gluconate Cloth  6 each Topical Daily   furosemide  80 mg Intravenous BID   Gerhardt's butt cream   Topical TID   midodrine  5 mg Oral TID WC   montelukast  10 mg Oral QHS   sodium chloride  flush  10-40 mL Intracatheter Q12H   sodium chloride  flush  3 mL Intravenous Q12H    Infusions:  amiodarone 60 mg/hr (02/13/24 0102)   furosemide (LASIX) 200 mg in dextrose 5 % 100 mL (2 mg/mL) infusion 15 mg/hr (02/13/24 0450)   milrinone 0.25 mcg/kg/min (  02/13/24 0103)    PRN Medications: acetaminophen  **OR** acetaminophen , polyethylene glycol, sodium chloride  flush  Patient Profile   83 y.o. male with history of permanent atrial fibrillation, CKD 4, COPD, kyphosis, PVD, lung cancer, RV failure, and HTN.   Assessment/Plan   1. Acute systolic CHF: Echo in 6/24 showed EF 50-55%, moderate RV enlargement/moderate RV dysfunction, IVC dilated.  This admission, patient presents in AF with RVR with elevated lactate.  Echo shows worsening especially of LV function with EF < 20%, moderate RV enlargement with severe RV systolic dysfunction, moderate-severe TR, dilated IVC. 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  He is markedly volume overloaded on exam with evidence for low output HF by lactate 3.3. Milrinone started. LA now clear 1.5. - Coox 62 on Milrinone 0.25 mcg/kg/min; continue - Control rate with amiodarone gtt 60 mg/hr. Will plan to give additional amio bolus after morning midodrine dose. - He is a poor HD candidate and clearly states  that he would not want dialysis if renal function were to worsen.  - Minimal UOP on Lasix gtt overnight. Re-bolus 80 mg IV and increase gtt. Will give Metolazone 5 mg x1 today - Increase midodrine to 10 mg tid with hypotension  - Stop metoprolol  and diltiazem with marked volume overload and low output HF.  - Not a candidate for advanced therapies with age, frailty, AKI on CKD stage IV.  2. Atrial fibrillation: This has been deemed permanent.  Had been off metoprolol , diltiazem, and Eliquis for several days due to nausea and admitted with AF/RVR 150s.  - Continue amiodarone gtt 60 mg/hr for rate control. Give additional bolus after AM midodrine dose - Continue Eliquis 2.5 bid.  3. AKI on CKD stage 4: Baseline creatinine seems to be around 2.5, up to 3.2 today in setting of low output HF with cardiorenal syndrome.  Patient would be poor HD candidate and would not want it under any circumstances.  - diuresis as above. Cr slightly up 3.2>3.29 4. Code status: DNR/DNI.  He would not want HD.  He is not a candidate for advances therapies.  At baseline, lives in assisted living.    Length of Stay: 1  [place] Lee, NP  02/13/2024, 6:54 AM  Advanced Heart Failure Team Pager [phone] (M-F; 7a - 5p)  Please contact [facility] for night-coverage after hours (5p -7a ) and weekends on [email or web address]  Patient seen with NP, I formulated the plan and agree with the above note.   Lactate down to 1.5 today, co-ox 62% on milrinone 0.25.  HR remains 120s-130s in AF on amiodarone 60 mg/hr. He has not diuresed much yet, CVP 20 on my read. He is on HFNC 4L.  Creatinine 3.2 => 3.29.   Comfortable at rest.   General: NAD Neck: JVP 16+, no thyromegaly or thyroid nodule.  Lungs: Clear to auscultation bilaterally with normal respiratory effort. CV: Nondisplaced PMI.  Heart tachy, irregular S1/S2, no S3/S4, 2/6 HSM LLSB.  Chronic LE edema.  Abdomen: Soft, nontender, no hepatosplenomegaly, no distention.  Skin: Intact  without lesions or rashes.  Neurologic: Alert and oriented x 3.  Psych: Normal affect. Extremities: No clubbing or cyanosis.  HEENT: Normal.   Lactate down and co-ox acceptable on milrinone 0.25, but still markedly volume overloaded and not diuresing well yet.  Drop in EF may be related to chronically elevated HR with permanent AF, daughter showed me his HR readings and  HR has been 120s-140s for at least the last month.  - Continue milrinone 0.25 - Continue amiodarone 60 mg/hr for rate control, still running high.   - Increase Lasix gtt to 60 mg/hr and will give metolazone 5 mg bid today.  - Continue midodrine to maintain MAP, will increase to 15 mg tid today with SBP in 80s.   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  He would be a poor HD candidate and states that he would not under any circumstance want HD.  Discussion with patient, wife, and daughter today about current situation and prognosis.  We will try to aggressively diurese him for the next 24-48 hrs, if no improvement or if he decompensates, would proceed with hospice/comfort care.  Palliative care has been consulted.   Ezra Shuck 02/13/2024 10:30 AM

## 2024-02-13 NOTE — Progress Notes (Signed)
   02/12/24 1935  Assess: MEWS Score  Temp 98.6 F (37 C)  BP (!) 88/69  MAP (mmHg) 76  Pulse Rate (!) 138  ECG Heart Rate (!) 137  Resp 18  Level of Consciousness Alert  SpO2 100 %  O2 Device HFNC  O2 Flow Rate (L/min) 6 L/min  Assess: MEWS Score  MEWS Temp 0  MEWS Systolic 1  MEWS Pulse 3  MEWS RR 0  MEWS LOC 0  MEWS Score 4  MEWS Score Color Red  Assess: if the MEWS score is Yellow or Red  Were vital signs accurate and taken at a resting state? Yes  Does the patient meet 2 or more of the SIRS criteria? No  MEWS guidelines implemented  No, previously red, continue vital signs every 4 hours  Notify: Charge Nurse/RN  Name of Charge Nurse/RN Notified Cooper, RN  Notify: Rapid Response  Name of Rapid Response RN Notified Alm Christmas  Date Rapid Response Notified 02/12/24  Time Rapid Response Notified 2000  Assess: SIRS CRITERIA  SIRS Temperature  0  SIRS Respirations  0  SIRS Pulse 1  SIRS WBC 0  SIRS Score Sum  1

## 2024-02-14 DIAGNOSIS — Z515 Encounter for palliative care: Secondary | ICD-10-CM

## 2024-02-14 DIAGNOSIS — I509 Heart failure, unspecified: Secondary | ICD-10-CM

## 2024-02-14 DIAGNOSIS — N179 Acute kidney failure, unspecified: Secondary | ICD-10-CM | POA: Diagnosis not present

## 2024-02-14 DIAGNOSIS — I4891 Unspecified atrial fibrillation: Secondary | ICD-10-CM

## 2024-02-14 DIAGNOSIS — J9601 Acute respiratory failure with hypoxia: Secondary | ICD-10-CM | POA: Diagnosis not present

## 2024-02-14 DIAGNOSIS — I5021 Acute systolic (congestive) heart failure: Secondary | ICD-10-CM | POA: Diagnosis not present

## 2024-02-14 DIAGNOSIS — Z66 Do not resuscitate: Secondary | ICD-10-CM

## 2024-02-14 DIAGNOSIS — R570 Cardiogenic shock: Secondary | ICD-10-CM | POA: Diagnosis not present

## 2024-02-14 DIAGNOSIS — Z7189 Other specified counseling: Secondary | ICD-10-CM | POA: Diagnosis not present

## 2024-02-14 LAB — BASIC METABOLIC PANEL WITH GFR
Anion gap: 13 (ref 5–15)
BUN: 79 mg/dL — ABNORMAL HIGH (ref 8–23)
CO2: 23 mmol/L (ref 22–32)
Calcium: 8.2 mg/dL — ABNORMAL LOW (ref 8.9–10.3)
Chloride: 92 mmol/L — ABNORMAL LOW (ref 98–111)
Creatinine, Ser: 3.63 mg/dL — ABNORMAL HIGH (ref 0.61–1.24)
GFR, Estimated: 16 mL/min — ABNORMAL LOW (ref >60)
Glucose, Bld: 255 mg/dL — ABNORMAL HIGH (ref 70–99)
Potassium: 4.4 mmol/L (ref 3.5–5.1)
Sodium: 128 mmol/L — ABNORMAL LOW (ref 135–145)

## 2024-02-14 LAB — MAGNESIUM: Magnesium: 2.1 mg/dL (ref 1.7–2.4)

## 2024-02-14 LAB — COOXEMETRY PANEL
Carboxyhemoglobin: 1.3 % (ref 0.5–1.5)
Methemoglobin: <0.7 % (ref 0.0–1.5)
O2 Saturation: 58.7 %
Total hemoglobin: 11.4 g/dL — ABNORMAL LOW (ref 12.0–16.0)

## 2024-02-14 MED ORDER — LORAZEPAM 2 MG/ML PO CONC
1.0000 mg | ORAL | Status: DC | PRN
  Filled 2024-02-14: qty 1
  Filled 2024-02-14: qty 0.5

## 2024-02-14 MED ORDER — SODIUM CHLORIDE 0.9 % IV SOLN
12.5000 mg | Freq: Four times a day (QID) | INTRAVENOUS | Status: DC | PRN
  Administered 2024-02-14: 12.5 mg via INTRAVENOUS
  Filled 2024-02-14: qty 12.5

## 2024-02-14 MED ORDER — GLYCOPYRROLATE 0.2 MG/ML IJ SOLN: 0.2000 mg | INTRAMUSCULAR | Status: DC | PRN

## 2024-02-14 MED ORDER — ALBUTEROL SULFATE (2.5 MG/3ML) 0.083% IN NEBU: 2.5000 mg | INHALATION_SOLUTION | RESPIRATORY_TRACT | Status: DC | PRN

## 2024-02-14 MED ORDER — BIOTENE DRY MOUTH MT LIQD: 15.0000 mL | OROMUCOSAL | Status: DC | PRN

## 2024-02-14 MED ORDER — MORPHINE SULFATE (CONCENTRATE) 10 MG /0.5 ML PO SOLN: 5.0000 mg | ORAL | Status: DC | PRN

## 2024-02-14 MED ORDER — HYDROMORPHONE HCL 1 MG/ML IJ SOLN: 0.5000 mg | INTRAMUSCULAR | Status: DC | PRN

## 2024-02-14 MED ORDER — BISACODYL 10 MG RE SUPP: 10.0000 mg | Freq: Every day | RECTAL | Status: DC | PRN

## 2024-02-14 MED ORDER — ONDANSETRON 4 MG PO TBDP: 4.0000 mg | ORAL_TABLET | Freq: Three times a day (TID) | ORAL | Status: DC | PRN

## 2024-02-14 MED ORDER — HALOPERIDOL LACTATE 5 MG/ML IJ SOLN: 0.5000 mg | INTRAMUSCULAR | Status: DC | PRN

## 2024-02-14 MED ORDER — HYDROMORPHONE HCL 2 MG PO TABS
1.0000 mg | ORAL_TABLET | ORAL | Status: DC | PRN
  Administered 2024-02-14: 2 mg via ORAL
  Filled 2024-02-14: qty 1

## 2024-02-14 MED ORDER — POLYVINYL ALCOHOL 1.4 % OP SOLN
1.0000 [drp] | Freq: Four times a day (QID) | OPHTHALMIC | Status: DC | PRN
Start: 2024-02-14 — End: 2024-02-15

## 2024-02-14 MED ORDER — HYDROMORPHONE HCL 2 MG PO TABS: 1.0000 mg | ORAL_TABLET | ORAL | Status: DC | PRN

## 2024-02-14 MED ORDER — HYDROMORPHONE HCL 1 MG/ML IJ SOLN
1.0000 mg | INTRAMUSCULAR | Status: DC | PRN
  Administered 2024-02-15: 2 mg via INTRAVENOUS
  Filled 2024-02-14 (×2): qty 2

## 2024-02-14 MED ORDER — METOCLOPRAMIDE HCL 5 MG/ML IJ SOLN
5.0000 mg | Freq: Once | INTRAMUSCULAR | Status: AC
  Administered 2024-02-14: 5 mg via INTRAVENOUS
  Filled 2024-02-14: qty 2

## 2024-02-14 NOTE — Progress Notes (Addendum)
 PROGRESS NOTE    Cory Morris  FMW:[id number] DOB: [birth year] DOA: 02/12/2024 PCP: Sherlynn Madden, MD  82/M chronically ill with permanent A-fib, CKD 4, lung cancer sp XRT, COPD, chronic diastolic CHF, RV failure, hypertension presented to the ED ED 7/2 from PCPs office with hypotension tachycardia and shortness of breath.  In the ER he was noted to be in A-fib RVR, chest x-ray noted bilateral pleural effusion, hypoxic requiring nonrebreather mask, repeat echo with severe biventricular failure and creatinine of 3.2, lactic acid 3.3, BNP 3326 - Admitted, heart failure team consulting placed on milrinone  and diuretics - Poor prognosis, palliative consulted  Subjective: - Feels bad,, poor urine output, poor oral intake,+ nausea  Assessment and Plan:  Acute biventricular failure - Known RV failure, echo this admission with a EF less than 20%, severely reduced RV, severe TR - Presented in cardiogenic shock, lactic acidosis - Heart failure team following, currently on high-dose IV Lasix , milrinone  and midodrine  -Very poor response so far, MAPs low, poor urine output, not a dialysis candidate -Discussed with daughter, suggested consideration of hospice/comfort focused care, will ask palliative care to evaluate today if possible  Atrial fibrillation with RVR - Currently on amiodarone  gtt., Eliquis   AKI on CKD 4 - Baseline creatinine is 2.5-3 - Creatinine worse in the setting of cardiogenic shock, BiV failure - Followed by palliative care, would not want to pursue dialysis  COPD/chronic respiratory failure - Trelegy changed to Breztri , continue Singulair   History of lung cancer -sp XRT  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 - Continue prednisone   Goals: Chronically ill debilitated 82/M with history of COPD, lung cancer, permanent A-fib, CKD 4 now with severe biventricular failure, cardiogenic shock, prognosis is poor, he is followed by palliative care as outpatient,  palliative consulted now  DVT prophylaxis: Eliquis  Code Status: DNR Family Communication: Daughter at bedside Disposition Plan:   Consultants:    Procedures:   Antimicrobials:    Objective: Vitals:   02/14/24 0745 02/14/24 0746 02/14/24 0818 02/14/24 0916  BP: (!) 82/62 (!) 82/62  90/65  Pulse: (!) 130  (!) 124   Resp: 16  18   Temp: (!) 96.8 F (36 C)     TempSrc: Axillary     SpO2: 95%     Weight:      Height:        Intake/Output Summary (Last 24 hours) at 02/14/2024 1026 Last data filed at 02/13/2024 1900 Gross per 24 hour  Intake 232.87 ml  Output 350 ml  Net -117.13 ml   Filed Weights   02/12/24 1330 02/12/24 1624 02/14/24 0500  Weight: 79 kg 76.4 kg 82.5 kg    Examination:  General exam: Chronically ill frail elderly male, laying in bed, AAO x 2 HEENT: Positive JVD CVS: S1-S2, irregular rhythm Lungs: Decreased breath sounds at the bases Abdomen: Soft, nontender, bowel sounds present Extremities: 1+ edema, tender in both lower legs, ankle and great toes are tender as well Psychiatry: Flat affect    Data Reviewed:   CBC: Recent Labs  Lab 02/12/24 1107 02/12/24 1112 02/13/24 0223  WBC 9.2  --  8.7  NEUTROABS 8.0*  --   --   HGB 13.2 15.0 11.5*  HCT 44.2 44.0 36.6*  MCV 89.5  --  86.1  PLT 231  --  193   Basic Metabolic Panel: Recent Labs  Lab 02/12/24 1107 02/12/24 1112 02/12/24 1237 02/13/24 0223 02/14/24 0420  NA 134* 135  --  135 128*  K 4.4 4.6  --  4.0 4.4  CL 98  --   --  99 92*  CO2 17*  --   --  22 23  GLUCOSE 120*  --   --  125* 255*  BUN 73*  --   --  76* 79*  CREATININE 3.20*  --   --  3.29* 3.63*  CALCIUM  9.4  --   --  8.8* 8.2*  MG  --   --  2.4 2.2 2.1   GFR: Estimated Creatinine Clearance: 15.9 mL/min (A) (by C-G formula based on SCr of 3.63 mg/dL (H)). Liver Function Tests: Recent Labs  Lab 02/12/24 1107 02/13/24 0223  AST 29 31  ALT 47* 41  ALKPHOS 81 63  BILITOT 1.3* 1.0  PROT 7.8 6.1*  ALBUMIN 3.6  2.9*   No results for input(s): LIPASE, AMYLASE in the last 168 hours. No results for input(s): AMMONIA in the last 168 hours. Coagulation Profile: Recent Labs  Lab 02/12/24 1108  INR 1.2   Cardiac Enzymes: No results for input(s): CKTOTAL, CKMB, CKMBINDEX, TROPONINI in the last 168 hours. BNP (last 3 results) No results for input(s): PROBNP in the last 8760 hours. HbA1C: No results for input(s): HGBA1C in the last 72 hours. CBG: No results for input(s): GLUCAP in the last 168 hours. Lipid Profile: No results for input(s): CHOL, HDL, LDLCALC, TRIG, CHOLHDL, LDLDIRECT in the last 72 hours. Thyroid Function Tests: Recent Labs    02/12/24 1524  TSH 1.139   Anemia Panel: No results for input(s): VITAMINB12, FOLATE, FERRITIN, TIBC, IRON, RETICCTPCT in the last 72 hours. Urine analysis:    Component Value Date/Time   COLORURINE YELLOW 04/29/2023 1634   APPEARANCEUR CLEAR 04/29/2023 1634   LABSPEC 1.009 04/29/2023 1634   PHURINE 5.0 04/29/2023 1634   GLUCOSEU NEGATIVE 04/29/2023 1634   HGBUR MODERATE (A) 04/29/2023 1634   BILIRUBINUR NEGATIVE 04/29/2023 1634   KETONESUR NEGATIVE 04/29/2023 1634   PROTEINUR 30 (A) 04/29/2023 1634   NITRITE NEGATIVE 04/29/2023 1634   LEUKOCYTESUR NEGATIVE 04/29/2023 1634   Sepsis Labs: @LABRCNTIP (procalcitonin:4,lacticidven:4)  ) Recent Results (from the past 240 hours)  MRSA Next Gen by PCR, Nasal     Status: None   Collection Time: 02/12/24  7:01 PM   Specimen: Nasal Mucosa; Nasal Swab  Result Value Ref Range Status   MRSA by PCR Next Gen NOT DETECTED NOT DETECTED Final    Comment: (NOTE) The GeneXpert MRSA Assay (FDA approved for NASAL specimens only), is one component of a comprehensive MRSA colonization surveillance program. It is not intended to diagnose MRSA infection nor to guide or monitor treatment for MRSA infections. Test performance is not FDA approved in patients less than 53  years old. Performed at [facility], [address] [place], [place] [address]      Radiology Studies: ECHOCARDIOGRAM COMPLETE Result Date: 02/13/2024    ECHOCARDIOGRAM REPORT   Patient Name:   Cory Morris Date of Exam: 02/13/2024 Medical Rec #:  [id number]        Height:       67.0 in Accession #:    [id number]       Weight:       168.4 lb Date of Birth:  [birth year]       BSA:          1.880 m Patient Age:    82 years         BP:  92/74 mmHg Patient Gender: M                HR:           128 bpm. Exam Location:  Inpatient Procedure: 2D Echo, Cardiac Doppler, Color Doppler and Intracardiac            Opacification Agent (Both Spectral and Color Flow Doppler were            utilized during procedure). Indications:    Atrial fibrillation                 CHF  History:        Patient has prior history of Echocardiogram examinations, most                 recent 01/29/2023. CHF, COPD, Arrythmias:Atrial Fibrillation;                 Risk Factors:Former Smoker.  Sonographer:    Therisa Crouch Referring Phys: [id number] MARSA NOVAK MELVIN IMPRESSIONS  1. Left ventricular ejection fraction, by estimation, is <20%. The left ventricle has severely decreased function. The left ventricle demonstrates global hypokinesis. Left ventricular diastolic function could not be evaluated.  2. Right ventricular systolic function is severely reduced. The right ventricular size is severely enlarged. There is mildly elevated pulmonary artery systolic pressure.  3. Left atrial size was moderately dilated.  4. Right atrial size was moderately dilated.  5. A small pericardial effusion is present. The pericardial effusion is surrounding the apex.  6. The mitral valve is normal in structure. Trivial mitral valve regurgitation. No evidence of mitral stenosis.  7. Tricuspid valve regurgitation is severe.  8. The aortic valve is tricuspid. There is mild calcification of the aortic valve. Aortic valve regurgitation is trivial. No  aortic stenosis is present.  9. The inferior vena cava is dilated in size with <50% respiratory variability, suggesting right atrial pressure of 15 mmHg. Comparison(s): No significant change from prior study. Prior images reviewed side by side. Conclusion(s)/Recommendation(s): No left ventricular mural or apical thrombus/thrombi. Severe biventricular failure. FINDINGS  Left Ventricle: Left ventricular ejection fraction, by estimation, is <20%. The left ventricle has severely decreased function. The left ventricle demonstrates global hypokinesis. Definity  contrast agent was given IV to delineate the left ventricular endocardial borders. The left ventricular internal cavity size was normal in size. There is no left ventricular hypertrophy. Left ventricular diastolic function could not be evaluated due to atrial fibrillation. Left ventricular diastolic function could not be evaluated. Right Ventricle: The right ventricular size is severely enlarged. No increase in right ventricular wall thickness. Right ventricular systolic function is severely reduced. There is mildly elevated pulmonary artery systolic pressure. The tricuspid regurgitant velocity is 2.40 m/s, and with an assumed right atrial pressure of 15 mmHg, the estimated right ventricular systolic pressure is 38.0 mmHg. Left Atrium: Left atrial size was moderately dilated. Right Atrium: Right atrial size was moderately dilated. Pericardium: A small pericardial effusion is present. The pericardial effusion is surrounding the apex. Mitral Valve: The mitral valve is normal in structure. Mild to moderate mitral annular calcification. Trivial mitral valve regurgitation. No evidence of mitral valve stenosis. Tricuspid Valve: The tricuspid valve is normal in structure. Tricuspid valve regurgitation is severe. No evidence of tricuspid stenosis. Aortic Valve: The aortic valve is tricuspid. There is mild calcification of the aortic valve. Aortic valve regurgitation is  trivial. No aortic stenosis is present. Pulmonic Valve: The pulmonic valve was grossly normal. Pulmonic valve regurgitation is  not visualized. No evidence of pulmonic stenosis. Aorta: The aortic root and ascending aorta are structurally normal, with no evidence of dilitation. Venous: The inferior vena cava is dilated in size with less than 50% respiratory variability, suggesting right atrial pressure of 15 mmHg. IAS/Shunts: The atrial septum is grossly normal. Additional Comments: There is pleural effusion in both left and right lateral regions.  LEFT VENTRICLE PLAX 2D LVIDd:         5.00 cm LVIDs:         4.60 cm LV PW:         0.80 cm LV IVS:        0.90 cm LVOT diam:     2.10 cm LVOT Area:     3.46 cm  RIGHT VENTRICLE          IVC RV Basal diam:  6.00 cm  IVC diam: 2.50 cm RV Mid diam:    4.60 cm LEFT ATRIUM             Index        RIGHT ATRIUM           Index LA diam:        4.10 cm 2.18 cm/m   RA Area:     23.60 cm LA Vol (A2C):   76.0 ml 40.43 ml/m  RA Volume:   86.90 ml  46.23 ml/m LA Vol (A4C):   79.1 ml 42.08 ml/m LA Biplane Vol: 77.9 ml 41.44 ml/m   AORTA Ao Root diam: 3.70 cm Ao Asc diam:  3.10 cm TRICUSPID VALVE TR Peak grad:   23.0 mmHg TR Vmax:        240.00 cm/s  SHUNTS Systemic Diam: 2.10 cm Shelda Bruckner MD Electronically signed by Shelda Bruckner MD Signature Date/Time: 02/13/2024/2:22:16 PM    Final    DG Chest Port 1 View Result Date: 02/12/2024 CLINICAL DATA:  Status post PICC placement. EXAM: PORTABLE CHEST 1 VIEW COMPARISON:  Earlier radiograph dated 02/12/2024. FINDINGS: Interval placement of a right-sided PICC with tip over central SVC. Similar appearance of bilateral pleural effusions and associated lung densities. No pneumothorax. Stable cardiac silhouette. No acute osseous pathology. IMPRESSION: Right-sided PICC with tip over central SVC.  No pneumothorax. Electronically Signed   By: Vanetta Chou M.D.   On: 02/12/2024 19:08   ECHOCARDIOGRAM COMPLETE Result  Date: 02/12/2024    ECHOCARDIOGRAM REPORT   Patient Name:   Cory Morris Date of Exam: 02/12/2024 Medical Rec #:  [id number]        Height:       68.0 in Accession #:    [id number]       Weight:       176.0 lb Date of Birth:  Aug 17, 1940       BSA:          1.936 m Patient Age:    82 years         BP:           138/118 mmHg Patient Gender: M                HR:           140 bpm. Exam Location:  Inpatient Procedure: 2D Echo (Both Spectral and Color Flow Doppler were utilized during            procedure). STAT ECHO Indications:    CAD of native vessel  History:        Patient has no prior history of  Echocardiogram examinations.                 COPD and chronic kidney disease, Arrythmias:Atrial Fibrillation;                 Risk Factors:Hypertension and Dyslipidemia.  Sonographer:    Tinnie Barefoot RDCS Referring Phys: [id number] TATUM N LEMLY IMPRESSIONS  1. Left ventricular ejection fraction, by estimation, is <20%. The left ventricle has severely decreased function. The left ventricle demonstrates global hypokinesis. Left ventricular diastolic parameters are indeterminate.  2. Right ventricular systolic function is severely reduced. The right ventricular size is moderately enlarged. There is normal pulmonary artery systolic pressure. The estimated right ventricular systolic pressure is 30.7 mmHg.  3. Left atrial size was mildly dilated.  4. Right atrial size was mildly dilated.  5. The mitral valve is normal in structure. Trivial mitral valve regurgitation. No evidence of mitral stenosis.  6. The tricuspid valve is abnormal. Tricuspid valve regurgitation is moderate to severe, possibly severe given hepatic vein systolic doppler flow reversal.  7. The aortic valve is tricuspid. There is mild calcification of the aortic valve. Aortic valve regurgitation is trivial. No aortic stenosis is present.  8. The inferior vena cava is dilated in size with <50% respiratory variability, suggesting right atrial pressure of 15  mmHg. FINDINGS  Left Ventricle: Left ventricular ejection fraction, by estimation, is <20%. The left ventricle has severely decreased function. The left ventricle demonstrates global hypokinesis. The left ventricular internal cavity size was normal in size. There is no  left ventricular hypertrophy. Left ventricular diastolic parameters are indeterminate. Right Ventricle: The right ventricular size is moderately enlarged. No increase in right ventricular wall thickness. Right ventricular systolic function is severely reduced. There is normal pulmonary artery systolic pressure. The tricuspid regurgitant velocity is 1.98 m/s, and with an assumed right atrial pressure of 15 mmHg, the estimated right ventricular systolic pressure is 30.7 mmHg. Left Atrium: Left atrial size was mildly dilated. Right Atrium: Right atrial size was mildly dilated. Pericardium: Trivial pericardial effusion is present. Mitral Valve: The mitral valve is normal in structure. Mild to moderate mitral annular calcification. Trivial mitral valve regurgitation. No evidence of mitral valve stenosis. Tricuspid Valve: The tricuspid valve is abnormal. Tricuspid valve regurgitation is moderate to severe. Aortic Valve: The aortic valve is tricuspid. There is mild calcification of the aortic valve. Aortic valve regurgitation is trivial. No aortic stenosis is present. Pulmonic Valve: The pulmonic valve was normal in structure. Pulmonic valve regurgitation is not visualized. Aorta: The aortic root is normal in size and structure. Venous: The inferior vena cava is dilated in size with less than 50% respiratory variability, suggesting right atrial pressure of 15 mmHg. IAS/Shunts: No atrial level shunt detected by color flow Doppler.  LEFT VENTRICLE PLAX 2D LVIDd:         4.60 cm LVIDs:         4.10 cm LV PW:         0.90 cm LV IVS:        0.80 cm LVOT diam:     1.90 cm LVOT Area:     2.84 cm  RIGHT VENTRICLE          IVC RV Basal diam:  3.20 cm  IVC diam:  2.40 cm LEFT ATRIUM             Index        RIGHT ATRIUM           Index LA diam:  3.80 cm 1.96 cm/m   RA Area:     19.40 cm LA Vol (A2C):   62.4 ml 32.24 ml/m  RA Volume:   63.10 ml  32.60 ml/m LA Vol (A4C):   71.5 ml 36.94 ml/m LA Biplane Vol: 67.4 ml 34.82 ml/m   AORTA Ao Root diam: 3.40 cm TRICUSPID VALVE TR Peak grad:   15.7 mmHg TR Vmax:        198.00 cm/s  SHUNTS Systemic Diam: 1.90 cm Dalton McleanMD Electronically signed by Ezra Kanner Signature Date/Time: 02/12/2024/1:46:17 PM    Final    US  EKG SITE RITE Result Date: 02/12/2024 If Site Rite image not attached, placement could not be confirmed due to current cardiac rhythm.  DG Chest Portable 1 View Result Date: 02/12/2024 CLINICAL DATA:  SOB. Eval for pneumonia/pulm edema EXAM: PORTABLE CHEST - 1 VIEW COMPARISON:  September 13, 2021, January 16, 2024 FINDINGS: Small to moderate right pleural effusion. Small left pleural effusion. Peripheral and bibasilar predominant interstitial opacities. Patchy opacities in the lung bases. No pneumothorax. Mild cardiomegaly. Aortic atherosclerosis. No acute fracture or destructive lesions. Multilevel thoracic osteophytosis. IMPRESSION: Small to moderate right pleural effusion with small left pleural effusion. Patchy opacities in both lung bases, likely atelectasis. Alternatively, superimposed bronchopneumonia or aspiration could have this appearance in the correct clinical context. Electronically Signed   By: Rogelia Myers M.D.   On: 02/12/2024 11:56     Scheduled Meds:  apixaban   2.5 mg Oral BID   budesonide -glycopyrrolate -formoterol   2 puff Inhalation BID   Chlorhexidine  Gluconate Cloth  6 each Topical Daily   Gerhardt's butt cream   Topical TID   metolazone   5 mg Oral BID   midodrine   15 mg Oral TID WC   montelukast   10 mg Oral QHS   predniSONE   40 mg Oral Q breakfast   sodium chloride  flush  10-40 mL Intracatheter Q12H   sodium chloride  flush  3 mL Intravenous Q12H   Continuous  Infusions:  amiodarone  60 mg/hr (02/14/24 0912)   furosemide  (LASIX ) 200 mg in dextrose  5 % 100 mL (2 mg/mL) infusion 30 mg/hr (02/14/24 0915)   milrinone  0.25 mcg/kg/min (02/14/24 0912)   promethazine  (PHENERGAN ) injection (IM or IVPB)       LOS: 2 days    Time spent:    Sigurd Pac, MD [facility]   02/14/2024, 10:26 AM

## 2024-02-14 NOTE — Plan of Care (Signed)
   Problem: Education: Goal: Knowledge of General Education information will improve Description Including pain rating scale, medication(s)/side effects and non-pharmacologic comfort measures Outcome: Progressing   Problem: Health Behavior/Discharge Planning: Goal: Ability to manage health-related needs will improve Outcome: Progressing

## 2024-02-14 NOTE — Plan of Care (Signed)
   Problem: Education: Goal: Knowledge of General Education information will improve Description: Including pain rating scale, medication(s)/side effects and non-pharmacologic comfort measures Outcome: Progressing   Problem: Clinical Measurements: Goal: Respiratory complications will improve Outcome: Progressing   Problem: Elimination: Goal: Will not experience complications related to bowel motility Outcome: Progressing

## 2024-02-14 NOTE — Progress Notes (Signed)
 Morning cvp reading 16

## 2024-02-14 NOTE — Progress Notes (Addendum)
 Advanced Heart Failure Rounding Note  Cardiologist: Cory JAYSON Maxcy, MD  Chief Complaint: Heart Failure  Subjective:   7/3 lasix  drip increased to 30 mg per hour and given metolazone .  Remains on milrinone  0.25 mcg + amio 60 mg per hour + lasix  drip 30 mg per hour  Sluggish urine output.   Complaining of nausea.   Objective:    Weight Range: 82.5 kg Body mass index is 28.49 kg/m.   Vital Signs:   Temp:  [97.6 F (36.4 C)-98 F (36.7 C)] 97.7 F (36.5 C) (07/04 0407) Pulse Rate:  [121-149] 121 (07/03 2313) Resp:  [16-28] 20 (07/04 0407) BP: (85-98)/(60-77) 91/63 (07/04 0407) SpO2:  [95 %-99 %] 99 % (07/03 2313) Weight:  [82.5 kg] 82.5 kg (07/04 0500) Last BM Date : 02/11/24  Weight change: Filed Weights   02/12/24 1330 02/12/24 1624 02/14/24 0500  Weight: 79 kg 76.4 kg 82.5 kg   Intake/Output:  Intake/Output Summary (Last 24 hours) at 02/14/2024 0741 Last data filed at 02/13/2024 1900 Gross per 24 hour  Intake 1063.46 ml  Output 550 ml  Net 513.46 ml   CVP 20-22 Physical Exam   General:   Appears weak/frail. Dyspneic with movement.  Neck: supple.JVP to jaw  Cor: PMI nondisplaced. Tachy Irregular rate & rhythm. No rubs, gallops or murmurs. Lungs: clear Abdomen: soft, nontender, nondistended.  Extremities: no cyanosis, clubbing, rash, R and LLE 1+ edema, tedner to touch.  Neuro: alert & oriented x3  Telemetry   AF 120-130s   EKG    No new EKG to review  Labs    CBC Recent Labs    02/12/24 1107 02/12/24 1112 02/13/24 0223  WBC 9.2  --  8.7  NEUTROABS 8.0*  --   --   HGB 13.2 15.0 11.5*  HCT 44.2 44.0 36.6*  MCV 89.5  --  86.1  PLT 231  --  193   Basic Metabolic Panel Recent Labs    92/96/74 0223 02/14/24 0420  NA 135 128*  K 4.0 4.4  CL 99 92*  CO2 22 23  GLUCOSE 125* 255*  BUN 76* 79*  CREATININE 3.29* 3.63*  CALCIUM  8.8* 8.2*  MG 2.2 2.1   Liver Function Tests Recent Labs    02/12/24 1107 02/13/24 0223  AST 29 31  ALT  47* 41  ALKPHOS 81 63  BILITOT 1.3* 1.0  PROT 7.8 6.1*  ALBUMIN 3.6 2.9*   BNP (last 3 results) Recent Labs    02/12/24 1107  BNP 3,326.2*   Medications:    Scheduled Medications:  apixaban   2.5 mg Oral BID   budesonide -glycopyrrolate -formoterol   2 puff Inhalation BID   Chlorhexidine  Gluconate Cloth  6 each Topical Daily   Gerhardt's butt cream   Topical TID   metolazone   5 mg Oral BID   midodrine   15 mg Oral TID WC   montelukast   10 mg Oral QHS   predniSONE   40 mg Oral Q breakfast   sodium chloride  flush  10-40 mL Intracatheter Q12H   sodium chloride  flush  3 mL Intravenous Q12H    Infusions:  amiodarone  60 mg/hr (02/14/24 0407)   furosemide  (LASIX ) 200 mg in dextrose  5 % 100 mL (2 mg/mL) infusion 30 mg/hr (02/14/24 0247)   milrinone  0.25 mcg/kg/min (02/13/24 1613)    PRN Medications: acetaminophen  **OR** acetaminophen , ondansetron  (ZOFRAN ) IV, polyethylene glycol, sodium chloride  flush  Patient Profile   83 y.o. male with history of permanent atrial fibrillation, CKD 4, COPD, kyphosis,  PVD, lung cancer, RV failure, and HTN.   Assessment/Plan   1. Acute systolic CHF: Echo in 6/24 showed EF 50-55%, moderate RV enlargement/moderate RV dysfunction, IVC dilated.  This admission, patient presents in AF with RVR with elevated lactate.  Echo shows worsening especially of LV function with EF < 20%, moderate RV enlargement with severe RV systolic dysfunction, moderate-severe TR, dilated IVC. 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  He is markedly volume overloaded on exam with evidence for low output HF by lactate 3.3. Milrinone  started. LA now clear 1.5. - Coox 59% on Milrinone  0.25 mcg/kg/min - CVP 20-22. Continue lasix  drip and give metolazone .  -  He is a poor HD candidate and clearly states that he would not want dialysis if renal function were to worsen.  -Continue midodrine  to 15  mg tid with hypotension   - Not a candidate for advanced therapies with age, frailty, AKI on CKD stage IV.  2. Atrial fibrillation: This has been deemed permanent.  Had been off metoprolol , diltiazem , and Eliquis  for several days due to nausea and admitted with AF/RVR 150s.  Rate remains uncontrolled. Continue amiodarone  gtt 60 mg/hr for rate control.  - Continue Eliquis  2.5 bid.  3. AKI on CKD stage 4: Baseline creatinine seems to be around 2.5, up to 3.2 today in setting of low output HF with cardiorenal syndrome.  Patient would be poor HD candidate and would not want it under any circumstances.  -Worsening renal function. Remains volume overloaded. Creatinine up to 3.6 BUN 79 4. Hyponatremia Limit free water.  5. Code status: DNR/DNI.  He would not want HD.  He is not a candidate for advances therapies.  At baseline, lives in assisted living. Palliative Care has been consulted. He continues to decline with MSOF. Could consider transfer to [facility] . Will reach out to primary team to add prn meds for air hunger. Discussed with him and his daughter.   Length of Stay: 2  [name], NP  02/14/2024, 7:41 AM  Advanced Heart Failure Team Pager [phone] (M-F; 7a - 5p)  Please contact [facility] for night-coverage after hours (5p -7a ) and weekends on [email or web address]  Patient seen with NP, I formulated the plan and agree with the above note.   Minimal UOP, 550 cc despite high dose diuretics and milrinone .  CVP remains > 20.  Co-ox 59% on milrinone  0.25.  HR remains 120s-130s in AF despite amiodarone  gtt 60 mg/hr. Creatinine rising, 3.2 => 3.63.   Patient is nauseated this morning, coughing.  Dyspneic with moving.   General: NAD Neck: JVP 16+, no thyromegaly or thyroid nodule.  Lungs: Clear to auscultation bilaterally with normal respiratory effort. CV: Nondisplaced PMI.  Heart tachy, irregular S1/S2, no S3/S4, no murmur.  Chronic LE edema.  Abdomen: Soft, nontender, no hepatosplenomegaly, no  distention.  Skin: Intact without lesions or rashes.  Neurologic: Alert and oriented x 3.  Psych: Normal affect. Extremities: No clubbing or cyanosis.  HEENT: Normal.   End stage CHF with AKI on CKD stage IV.  He has not responded to milrinone  + high dose diuretics.  He remains markedly volume overloaded with worsening renal function.  He would be a poor HD candidate and does not want HD.  - Long discussion with patient and daughter today.  They are interested in comfort care at this point which I think is very reasonable.  Will ask our  palliative care service today see today.  He will likely need [employer].  - For now, will continue milrinone  0.25 + Lasix  gtt 30 mg/hr + metolazone  5 bid + midodrine  15 tid + amiodarone  gtt.  Once he is transitioned to comfort care, would come off these meds.   Ezra Shuck 02/14/2024 10:30 AM

## 2024-02-14 NOTE — Consult Note (Signed)
 Palliative Care Consult Note                                  Date: 02/14/2024   Patient Name: Cory Morris  DOB: [birth year]  MRN: [id number]  Age / Sex: 83 y.o., male  PCP: Sherlynn Madden, MD Referring Physician: Fairy Frames, MD  Reason for Consultation: Establishing goals of care  HPI/Patient Profile: Palliative Care consult requested for goals of care discussion in this 83 y.o. male  with past medical history of CKD 4, HTN, HLD, gout, anemia, depression, IPF, COPD, lung cancer, and CHF. He was admitted on 02/12/2024 from PCP office with worsening shortness of breath. Per admission note family reports patient has not been taking home medications as prescribed. He was seen in PCP and found to be tachypneic, tachycardic, with hypotension. Work-up showed BNP 3326. Chest x-ray showed small to moderate right pleural effusion and small left pleural effusion. He was started on diltiazem  infusion for A-fib with RVR.   Past Medical History:  Diagnosis Date   Aortic atherosclerosis (HCC)    CKD (chronic kidney disease) stage 4, GFR 15-29 ml/min (HCC)    COPD (chronic obstructive pulmonary disease) (HCC)    Dysrhythmia    Family history of adverse reaction to anesthesia    his 2 daughters have N/V   Gout    Hypertension    Permanent atrial fibrillation (HCC)    Prediabetes     Subjective:   This NP Levon Freud reviewed medical records, received report from team, assessed the patient and then met at the patient's bedside with patient to discuss diagnosis, prognosis, GOC, EOL wishes disposition and options.  I was able to speak with patient's daughter/POA, Macario and his wife who were at bedside via speaker phone.    Concept of Palliative Care was introduced as specialized medical care for people and their families living with serious illness.  It focuses on providing relief from the symptoms and stress of a serious illness.  The goal  is to improve quality of life for both the patient and the family. Values and goals of care important to patient and family were attempted to be elicited.  I created space and opportunity for patient and family to explore state of health prior to admission, thoughts, and feelings.   Mr. Knebel lives with his wife at [facility] independent living for the past 4 years.  They have 2 daughters (1 who lives in [place] ).  Patient retired from [employer] where he worked for more than 20 years.  Prior to admission daughter shares patient's health has continued to drastically decline.  States he has been limited in mobility.  Daughter shares since around Thanksgiving, he has experienced a decline in health, marked by significant swelling in his legs and general malaise. Notable episodes of deterioration occurred at the end of January, in April, and most recently starting on June 21st.  We discussed His current illness and what it means in the larger context of His on-going co-morbidities. Natural disease trajectory and expectations were discussed.  Family verbalized understanding of current illness and co-morbidities.  They are realistic and understanding expressing drastic decline in his quality of life. Macario shares she has had discussions with the patient who has expressed he is ready when his time comes and his health decline and feeling poorly all the time and understanding he is faced no viable options  for a meaningful recovery.  I discussed the importance of continued conversation with family and their medical providers regarding overall plan of care and treatment options, ensuring decisions are within the context of the patients values and GOCs.  Questions and concerns were addressed. The patient and family was encouraged to call with questions or concerns.  PMT will continue to support holistically as needed.  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)  Peripheral arterial disease (HCC)  Hyperlipidemia  Essential hypertension  Gout  Chronic obstructive lung disease (HCC)  Anemia of renal disease  Acute respiratory failure with hypoxia (HCC)   Scheduled Meds:  apixaban   2.5 mg Oral BID   budesonide -glycopyrrolate -formoterol   2 puff Inhalation BID   Chlorhexidine  Gluconate Cloth  6 each Topical Daily   Gerhardt's butt cream   Topical TID   metolazone   5 mg Oral BID   midodrine   15 mg Oral TID WC   montelukast   10 mg Oral QHS   predniSONE   40 mg Oral Q breakfast   sodium chloride  flush  10-40 mL Intracatheter Q12H   sodium chloride  flush  3 mL Intravenous Q12H    Continuous Infusions:  amiodarone  60 mg/hr (02/14/24 0912)   furosemide  (LASIX ) 200 mg in dextrose  5 % 100 mL (2 mg/mL) infusion 30 mg/hr (02/14/24 0915)   milrinone  0.25 mcg/kg/min (02/14/24 0912)    PRN Meds: acetaminophen  **OR** acetaminophen , ondansetron  (ZOFRAN ) IV, polyethylene glycol, sodium chloride  flush  Allergies  Allergen Reactions   Ace Inhibitors Other (See Comments)    Unknown   Dronedarone Other (See Comments)    Unknown    Review of Systems  Constitutional:  Positive for activity change and appetite change.  Neurological:  Positive for weakness.  Unless otherwise noted, a complete review of systems is negative.  Physical Exam General: NAD, frail chronically-ill appearing Cardiovascular: regular rate and rhythm Pulmonary: clear ant fields, diminished bilaterally  Abdomen: soft, nontender, + bowel sounds Extremities: no edema, no joint deformities Skin: no rashes, warm and dry Neurological: AAO x 3  Vital Signs:  BP 90/65   Pulse (!) 124   Temp (!) 96.8 F (36 C) (Axillary)   Resp 18   Ht 5' 7 (1.702 m)   Wt 82.5 kg   SpO2 95%   BMI 28.49 kg/m  Pain Scale: 0-10 POSS *See Group Information*: S-Acceptable,Sleep, easy  to arouse Pain Score: 0-No pain  SpO2: SpO2: 95 % O2 Device:SpO2: 95 % O2 Flow Rate: .O2 Flow Rate (L/min): 4 L/min  IO: Intake/output summary:  Intake/Output Summary (Last 24 hours) at 02/14/2024 9061 Last data filed at 02/13/2024 1900 Gross per 24 hour  Intake 762.32 ml  Output 550 ml  Net 212.32 ml    LBM: Last BM Date : 02/11/24 Baseline Weight: Weight: 79 kg Most recent weight: Weight: 82.5 kg      Palliative Assessment/Data: PPS 10-20%   Advanced Care Planning:   Primary Decision Maker: NEXT OF KIN  Code Status/Advance Care Planning: DNR  A discussion was had today regarding advanced directives. Concepts specific to code status, artifical feeding and hydration, continued IV antibiotics and rehospitalization was had.  The difference between a aggressive medical intervention path and a palliative comfort care path was discussed.   Family confirms wishes for DNR/DNI.  I empathetically approached discussions regarding comfort focused care while hospitalized.  Education provided with understanding patient would  no longer receive aggressive medical interventions such as continuous vital signs, lab work, radiology testing, or medications not focused on comfort. All care would focus on how the patient is looking and feeling. This would include management of any symptoms that may cause discomfort, pain, shortness of breath, cough, nausea, agitation, anxiety, and/or secretions etc. Symptoms would be managed with medications and other non-pharmacological interventions such as spiritual support if requested, repositioning, music therapy, or therapeutic listening. Family verbalized understanding and appreciation.  Daughter confirms wishes to transition all care to focus on comfort as they have discussed as a family and unfortunately been preparing for this day.  Emotional support provided.   Education provided on hospice services outpatient were explained and offered. Patient and family  verbalized their understanding and awareness of hospice's goals and philosophy of care.  Daughter shares patient recently enrolled in outpatient palliative support with [facility] in April 2025.  We discussed outpatient hospice support in addition to inpatient facility as she specifically mentions continuing with [facility].  Education provided on beacon placed and referral process in addition to criteria for approval.  Daughter verbalized understanding expressing they are leaning towards inpatient facility however she would like to have further discussions with family members prior to making final decision.  Confirms wishes to proceed with comfort focused care in the interim of final complex decisions to be made about disposition.  Assessment & Plan:   SUMMARY OF RECOMMENDATIONS   DNR/DNI-as confirmed by daughter and patient. Transition care to focus on comfort.  Minimize medications.  Discontinue interventions with comfort focused. Family is clear and expressed wishes to focus on comfort for what time patient has left.  No escalation in care.  They would like to consider inpatient hospice specifically requesting [facility] given patient is currently under their outpatient palliative services.  Education provided on comfort measures while hospitalized at what care would look like under outpatient hospice care.  Discussed referral process.  Will place order for [facility] once family has made final decisions. PMT will continue to support and follow. Please call team line or secure chat assigned palliative provider with urgent unmet palliative needs.  Symptom Management:  Hydromorphone  PRN for pain/air hunger/comfort Robinul  PRN for excessive secretions Ativan  PRN for agitation/anxiety Zofran  PRN for nausea Liquifilm tears PRN for dry eyes Haldol  PRN for agitation/anxiety May have comfort feeding Comfort cart for family Unrestricted visitations in the setting of EOL (per policy) Oxygen PRN 2L or less  for comfort. No escalation.    Palliative Prophylaxis:  Aspiration, Bowel Regimen, Delirium Protocol, Eye Care, Frequent Pain Assessment, Oral Care, and Turn Reposition  Additional Recommendations (Limitations, Scope, Preferences): Full Comfort Care  Psycho-social/Spiritual:  Desire for further Chaplaincy support: no Additional Recommendations: Education on Hospice  Prognosis:  < 2 weeks (days/weeks)  Discharge Planning:  Hospice facility patient is established with [facility] outpatient palliative care to transition to their hospice services.  Discussed with: Patient's family,  RN, Dr. Fairy via secure chat.   Patient's family expressed understanding and was in agreement with this plan.    Time Total: 65 min   Visit consisted of counseling and education dealing with the complex and emotionally intense issues of symptom management and palliative care in the setting of serious and potentially life-threatening illness.  Signed by:  Levon Borer, AGPCNP-BC Pall[facility]   Phone: [phone] Pager: [phone] Amion: GEANNIE Freud   Thank you for allowing the Palliative Medicine Team to assist in the care of this patient. Please utilize secure  chat with additional questions, if there is no response within 30 minutes please call the above phone number. Palliative Medicine Team providers are available by phone from 7am to 5pm daily and can be reached through the team cell phone.  Should this patient require assistance outside of these hours, please call the patient's attending physician.  *Please note that this is a verbal dictation therefore any spelling or grammatical errors are due to the Dragon [employer] One system interpretation.

## 2024-02-15 DIAGNOSIS — Z515 Encounter for palliative care: Secondary | ICD-10-CM | POA: Diagnosis not present

## 2024-02-15 DIAGNOSIS — N184 Chronic kidney disease, stage 4 (severe): Secondary | ICD-10-CM | POA: Diagnosis not present

## 2024-02-15 DIAGNOSIS — I5033 Acute on chronic diastolic (congestive) heart failure: Secondary | ICD-10-CM

## 2024-02-15 DIAGNOSIS — J9601 Acute respiratory failure with hypoxia: Secondary | ICD-10-CM | POA: Diagnosis not present

## 2024-02-15 DIAGNOSIS — C3432 Malignant neoplasm of lower lobe, left bronchus or lung: Secondary | ICD-10-CM

## 2024-02-15 DIAGNOSIS — I5021 Acute systolic (congestive) heart failure: Secondary | ICD-10-CM | POA: Diagnosis not present

## 2024-02-15 DIAGNOSIS — N179 Acute kidney failure, unspecified: Secondary | ICD-10-CM | POA: Diagnosis not present

## 2024-02-15 DIAGNOSIS — I4891 Unspecified atrial fibrillation: Secondary | ICD-10-CM | POA: Diagnosis not present

## 2024-02-15 DIAGNOSIS — I739 Peripheral vascular disease, unspecified: Secondary | ICD-10-CM

## 2024-03-13 NOTE — Progress Notes (Signed)
 Patient ID: Cory Morris, male   DOB: 01-05-1941, 83 y.o.   MRN: [id number]     Advanced Heart Failure Rounding Note  Cardiologist: Vinie JAYSON Maxcy, MD  Chief Complaint: Heart Failure  Subjective:    Transitioned to comfort care yesterday.  Sleeping comfortably.   Objective:    Weight Range: 82.5 kg Body mass index is 28.49 kg/m.   Vital Signs:   Temp:  [97.5 F (36.4 C)-97.8 F (36.6 C)] 97.5 F (36.4 C) (07/04 1953) Pulse Rate:  [137] 137 (07/04 1953) Resp:  [12-20] 20 (07/04 1953) BP: (80-88)/(60-64) 80/64 (07/04 1953) SpO2:  [98 %] 98 % (07/04 1113) Last BM Date : 02/11/24  Weight change: Filed Weights   02/12/24 1330 02/12/24 1624 02/14/24 0500  Weight: 79 kg 76.4 kg 82.5 kg   Intake/Output:  Intake/Output Summary (Last 24 hours) at 03-03-24 1110 Last data filed at 02/14/2024 1656 Gross per 24 hour  Intake 1440.5 ml  Output --  Net 1440.5 ml    Physical Exam   General: Sleeping Neck: JVP 16+, no thyromegaly or thyroid nodule.  Lungs: Clear to auscultation bilaterally with normal respiratory effort. CV: Nondisplaced PMI.  Heart tachy, irregular S1/S2, no S3/S4, no murmur.  Chronic LE edema.    Abdomen: Soft, nontender, no hepatosplenomegaly, no distention.  Skin: Intact without lesions or rashes.  Neurologic: Sleeping Extremities: No clubbing or cyanosis.  HEENT: Normal.   Telemetry   Off telemetry.   EKG    No new EKG to review  Labs    CBC Recent Labs    02/12/24 1112 02/13/24 0223  WBC  --  8.7  HGB 15.0 11.5*  HCT 44.0 36.6*  MCV  --  86.1  PLT  --  193   Basic Metabolic Panel Recent Labs    92/96/74 0223 02/14/24 0420  NA 135 128*  K 4.0 4.4  CL 99 92*  CO2 22 23  GLUCOSE 125* 255*  BUN 76* 79*  CREATININE 3.29* 3.63*  CALCIUM  8.8* 8.2*  MG 2.2 2.1   Liver Function Tests Recent Labs    02/13/24 0223  AST 31  ALT 41  ALKPHOS 63  BILITOT 1.0  PROT 6.1*  ALBUMIN 2.9*   BNP (last 3 results) Recent Labs     02/12/24 1107  BNP 3,326.2*   Medications:    Scheduled Medications:  budesonide -glycopyrrolate -formoterol   2 puff Inhalation BID   Gerhardt's butt cream   Topical TID   montelukast   10 mg Oral QHS   predniSONE   40 mg Oral Q breakfast   sodium chloride  flush  10-40 mL Intracatheter Q12H   sodium chloride  flush  3 mL Intravenous Q12H    Infusions:  promethazine  (PHENERGAN ) injection (IM or IVPB) Stopped (02/14/24 1110)    PRN Medications: acetaminophen  **OR** acetaminophen , albuterol , antiseptic oral rinse, artificial tears, bisacodyl , glycopyrrolate , haloperidol  lactate, HYDROmorphone  (DILAUDID ) injection, HYDROmorphone , LORazepam , ondansetron  (ZOFRAN ) IV, ondansetron , polyethylene glycol, promethazine  (PHENERGAN ) injection (IM or IVPB), sodium chloride  flush  Patient Profile   83 y.o. male with history of permanent atrial fibrillation, CKD 4, COPD, kyphosis, PVD, lung cancer, RV failure, and HTN.   Assessment/Plan   1. Acute systolic CHF: Echo in 6/24 showed EF 50-55%, moderate RV enlargement/moderate RV dysfunction, IVC dilated.  This admission, patient presents in AF with RVR with elevated lactate.  Echo shows worsening especially of LV function with EF < 20%, moderate RV enlargement with severe RV systolic dysfunction, moderate-severe TR, dilated IVC. 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  He has been markedly volume overloaded on exam with evidence for low output HF by lactate 3.3. Milrinone  started and lactate cleared but creatinine continued to rise and he had minimal UOP.  Not a candidate for advanced therapies.  He would be a poor HD candidate and does not want HD.  After discussion with family, he is now DNR/DNI with comfort care and [facility].  - Off all cardiac meds, comfort care. 2. Atrial fibrillation: This has been deemed permanent.  Had been off metoprolol ,  diltiazem , and Eliquis  for several days due to nausea and admitted with AF/RVR 150s. Unable to control rate with amiodarone  gtt.  - Off all cardiac meds, comfort care.  3. AKI on CKD stage 4: Would not want HD under any circumstance.   Comfort care, palliative service following.  Currently sleeping.  Now awaiting [employer].   Length of Stay: 3  Ezra Shuck, MD  03-13-2024, 11:11 AM  Advanced Heart Failure Team Pager [phone] (M-F; 7a - 5p)  Please contact [facility] for night-coverage after hours (5p -7a ) and weekends on [email or web address]

## 2024-03-13 NOTE — Plan of Care (Signed)
  Problem: Education: Goal: Knowledge of General Education information will improve Description: Including pain rating scale, medication(s)/side effects and non-pharmacologic comfort measures Outcome: Progressing   Problem: Education: Goal: Knowledge of disease or condition will improve Outcome: Progressing Goal: Understanding of medication regimen will improve Outcome: Progressing

## 2024-03-13 NOTE — Progress Notes (Signed)
 Daily Progress Note   Patient Name: Cory Morris       Date: Mar 14, 2024 DOB: 1941/07/08  Age: 83 y.o. MRN#: [id number] Attending Physician: Fairy Frames, MD Primary Care Physician: Sherlynn Madden, MD Admit Date: 02/12/2024  Reason for Consultation/Follow-up: Establishing goals of care  Subjective: Medical records reviewed including progress notes, labs, imaging, MAR.  Patient required 1 dose 2 as needed IV Dilaudid  and 1 dose of as needed p.o. Dilaudid  overnight.  Patient assessed at the bedside.  He is sleeping comfortably.  His wife and daughter are at the bedside visiting.  They share that he has been sleeping since midnight without waking up.  Created space and opportunity for patient and family's thoughts and feelings on patient's current illness.  The natural disease trajectory and expectations at EOL were discussed.  Provided with gone from my sight booklet for additional support.  Family also shared that they have made the decision to proceed with [facility] referral, understanding that he is at risk for no longer being stable for transfer by the time referral is done.  Discussed with MD.  Discussed option of titrating opioids and scheduling or increasing doses if it appears his symptoms are no longer controlled with as needed medications.  Family verbalized understanding.  Questions and concerns addressed. PMT will continue to support holistically.   Length of Stay: 3   Physical Exam Vitals and nursing note reviewed.  Constitutional:      General: He is not in acute distress.    Appearance: He is ill-appearing.  Cardiovascular:     Rate and Rhythm: Tachycardia present.  Pulmonary:     Effort: Pulmonary effort is normal. No respiratory distress.  Skin:    General: Skin is warm and dry.  Psychiatric:         Behavior: Behavior is not agitated.             Vital Signs: BP (!) 80/64 (BP Location: Left Arm)   Pulse (!) 137   Temp (!) 97.5 F (36.4 C) (Oral)   Resp 20   Ht 5' 7 (1.702 m)   Wt 82.5 kg   SpO2 98%   BMI 28.49 kg/m  SpO2: SpO2: 98 % O2 Device: O2 Device: Nasal Cannula O2 Flow Rate: O2 Flow Rate (L/min): 2 L/min      Palliative Assessment/Data: 10%   Palliative Care Assessment & Plan   Patient Profile: Palliative Care consult requested for goals of care discussion in this 83 y.o. male  with past medical history of CKD 4, HTN, HLD, gout, anemia, depression, IPF, COPD, lung cancer, and CHF. He  was admitted on 02/12/2024 from PCP office with worsening shortness of breath. Per admission note family reports patient has not been taking home medications as prescribed. He was seen in PCP and found to be tachypneic, tachycardic, with hypotension. Work-up showed BNP 3326. Chest x-ray showed small to moderate right pleural effusion and small left pleural effusion. He was started on diltiazem  infusion for A-fib with RVR.   Assessment: End-of-life care  Recommendations/Plan: Continue DNR/DNI Continue comfort focused care including as needed opioids, no adjustments required today Notified [facility] and [facility] hospice liaison of interest in [facility] Psychosocial and emotional support provided PMT will continue to follow and support   Prognosis:  Hours - Days  Discharge Planning: To Be Determined  Care plan was discussed with patient's family, MD, hospice liaison, LCSW   MDM high         Caleb Prigmore P Belia Febo, PA-C  Palliative Medicine Team Team phone # [phone]  Thank you for allowing the Palliative Medicine Team to assist in the care of this patient. Please utilize secure chat with additional questions, if there is no response within 30 minutes please call the above phone number.  Palliative Medicine Team providers are available by phone from 7am to 7pm daily  and can be reached through the team cell phone.  Should this patient require assistance outside of these hours, please call the patient's attending physician.

## 2024-03-13 NOTE — Progress Notes (Signed)
 Patient seen and examined, daughter at bedside, sleeping comfortably, poorly responsive -82/M with severe biventricular failure and progressive renal failure, poor response to milrinone , diuretics was transition to comfort care after palliative care meeting yesterday -[facility] consult for [facility], quick hospital demise is also possibility  Sigurd Pac, MD

## 2024-03-13 NOTE — Death Summary Note (Incomplete)
 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

## 2024-03-13 DEATH — deceased

## 2024-04-01 ENCOUNTER — Ambulatory Visit: Admitting: Sports Medicine

## 2024-04-03 NOTE — Addendum Note (Signed)
 Encounter addended by: Sherwood Rise, PA-C on: 04/03/2024 11:40 AM  Actions taken: Clinical Note Signed

## 2024-04-13 NOTE — Discharge Summary (Signed)
 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)   Anemia of renal disease   Chronic obstructive lung disease (HCC)   Essential hypertension   Gout   Hyperlipidemia   Peripheral arterial disease (HCC)   Recurrent major depression in remission (HCC)   Atrial fibrillation with RVR (HCC)   Acute on chronic diastolic CHF (congestive heart failure) (HCC)   Acute renal failure superimposed on stage 4 chronic kidney disease (HCC)   History of present illness:  82/M chronically ill with permanent A-fib, CKD 4, lung cancer sp XRT, COPD, chronic diastolic CHF, RV failure, hypertension presented to the ED ED 7/2 from PCPs office with hypotension tachycardia and shortness of breath.  In the ER he was noted to be in A-fib RVR, chest x-ray noted bilateral pleural effusion, hypoxic requiring nonrebreather mask, repeat echo with severe biventricular failure and creatinine of 3.2, lactic acid 3.3, BNP 3326 - Admitted, heart failure team consulting placed on milrinone  and diuretics - Poor prognosis, palliative consulted  Hospital Course:  Acute biventricular failure - Known RV failure, echo this admission with a EF less than 20%, severely reduced RV, severe TR - Presented in cardiogenic shock, lactic acidosis - Heart failure team following, currently on high-dose IV Lasix , milrinone  and midodrine  -Very poor response so far, MAPs low, poor urine output, not a dialysis candidate -was transitioned to comfort care after palliative care meeting > then expired 02-20-24   Atrial fibrillation with RVR - Rx w amiodarone  gtt., Eliquis    AKI on CKD 4 - Baseline creatinine is 2.5-3 -  Creatinine worse in the setting of cardiogenic shock, BiV failure - Followed by palliative care, would not want to pursue dialysis   COPD/chronic respiratory failure   History of lung cancer -sp XRT   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 - Rx w prednisone    Signed:  Sigurd Pac  [facility] 03/30/2024, 2:14 PM

## 2024-04-20 ENCOUNTER — Ambulatory Visit: Admitting: Podiatry
